# Patient Record
Sex: Male | Born: 1985 | Race: White | Hispanic: No | Marital: Married | Smoking: Never smoker
Health system: Southern US, Community
[De-identification: ages and names within clinical notes are randomized; demographics above are authoritative.]

## PROBLEM LIST (undated history)

## (undated) DIAGNOSIS — Z86018 Personal history of other benign neoplasm: Secondary | ICD-10-CM

## (undated) HISTORY — PX: NO PAST SURGERIES: SHX2092

## (undated) HISTORY — DX: Personal history of other benign neoplasm: Z86.018

---

## 2013-02-19 ENCOUNTER — Emergency Department (HOSPITAL_COMMUNITY)
Admission: EM | Admit: 2013-02-19 | Discharge: 2013-02-19 | Disposition: A | Discharge status: Home / Self Care | Payer: 59 | Source: Home / Self Care

## 2013-02-19 ENCOUNTER — Encounter (HOSPITAL_COMMUNITY): Payer: Self-pay | Admitting: Emergency Medicine

## 2013-02-19 DIAGNOSIS — L719 Rosacea, unspecified: Secondary | ICD-10-CM

## 2013-02-19 DIAGNOSIS — S80869A Insect bite (nonvenomous), unspecified lower leg, initial encounter: Secondary | ICD-10-CM

## 2013-02-19 DIAGNOSIS — S90569A Insect bite (nonvenomous), unspecified ankle, initial encounter: Secondary | ICD-10-CM

## 2013-02-19 DIAGNOSIS — W57XXXA Bitten or stung by nonvenomous insect and other nonvenomous arthropods, initial encounter: Secondary | ICD-10-CM

## 2013-02-19 DIAGNOSIS — T7840XA Allergy, unspecified, initial encounter: Secondary | ICD-10-CM

## 2013-02-19 MED ORDER — CLINDAMYCIN PHOSPHATE 1 % EX GEL: Freq: Two times a day (BID) | CUTANEOUS | Status: DC

## 2013-02-19 MED ORDER — TRIAMCINOLONE ACETONIDE 0.1 % EX CREA: TOPICAL_CREAM | Freq: Two times a day (BID) | CUTANEOUS | Status: DC

## 2013-02-19 MED ORDER — PERMETHRIN 5 % EX CREA: TOPICAL_CREAM | CUTANEOUS | Status: DC

## 2013-02-19 NOTE — ED Notes (Signed)
Pt c/o of insect bites all over body. Noticed first bite two days ago on back of right leg. Bumps have spread from legs to feet and also a couple on arms. Not painful just very itchy. Has tried Hydrocortisone and Benadryl with relief. No fever, n/v, diarrhea. Also pt would like Rx for his rosacea.

## 2013-02-19 NOTE — ED Provider Notes (Signed)
History     CSN: [id number]  Arrival date & time 02/19/13  1114   First MD Initiated Contact with Patient 02/19/13 1152      Chief Complaint  Patient presents with  . Insect Bite    (Consider location/radiation/quality/duration/timing/severity/associated sxs/prior treatment) HPI Comments: 27 year old man has been experiencing itchy red bumps starting on his feet and lower legs for approximately 3 weeks. The small red bumps have spread to his upper extremities and lesser to his torso. All of these areas are primarily keeping is erythema with some areas performing a small papule. He has applied various OTC creams but they persist. They will disappear from one area in the informant another. He does not describe them as wheals or urticarial light. He denies constitutional symptoms. There is a cat in the house but he states he rarely goes into his room.  The second request is that of a cream to apply to his rosacea these had since childhood.   History reviewed. No pertinent past medical history.  History reviewed. No pertinent past surgical history.  History reviewed. No pertinent family history.  History  Substance Use Topics  . Smoking status: Never Smoker   . Smokeless tobacco: Not on file  . Alcohol Use: 1.2 oz/week    2 Cans of beer per week     Comment: on weekends      Review of Systems  Constitutional: Negative.   HENT: Negative.   Respiratory: Negative.   Gastrointestinal: Negative.   Skin:       Erythematous acneform lesions across the nose and upper face he is in a masklike fashion. Small red macules of different sizes some  as small papules. No lymphangitis, drainage or signs of infection.   Neurological: Negative.   Psychiatric/Behavioral: Negative.     Allergies  Review of patient's allergies indicates no known allergies.  Home Medications   Current Outpatient Rx  Name  Route  Sig  Dispense  Refill  . clindamycin (CLINDAGEL) 1 % gel   Topical   Apply  topically 2 (two) times daily.   30 g   0   . permethrin (ELIMITE) 5 % cream      Apply from head to toe then rinse 8 hours   60 g   0   . triamcinolone cream (KENALOG) 0.1 %   Topical   Apply topically 2 (two) times daily. Apply to bug bites bid for 2 weeks. May use on face   30 g   0     BP 126/59  Pulse 87  Temp(Src) 98.4 F (36.9 C) (Oral)  SpO2 100%  Physical Exam  ED Course  Procedures (including critical care time)  Labs Reviewed - No data to display No results found.   1. Allergic to bugs, initial encounter   2. Insect bite of lower limb, unspecified laterality, initial encounter   3. Rosacea       MDM  To light cream, apply from head to toe and wrist in 8 hours Triamcinolone cream apply to  Bug bites 3 times a day as needed.  Sherren Kerns, applied to rosacea BID. Obtain a primary care doctor as soon as possible for followup.        Hayden Rasmussen, NP 02/19/13 1225

## 2013-02-22 NOTE — ED Provider Notes (Signed)
Medical screening examination/treatment/procedure(s) were performed by resident physician or non-physician practitioner and as supervising physician I was immediately available for consultation/collaboration.   KINDL,JAMES DOUGLAS MD.   James D Kindl, MD 02/22/13 1025 

## 2017-10-30 ENCOUNTER — Encounter: Payer: Self-pay | Admitting: Physician Assistant

## 2017-10-30 ENCOUNTER — Ambulatory Visit (INDEPENDENT_AMBULATORY_CARE_PROVIDER_SITE_OTHER): Payer: 59 | Admitting: Physician Assistant

## 2017-10-30 VITALS — BP 118/76 | HR 80 | Temp 98.3°F | Resp 16 | Ht 70.0 in | Wt 251.0 lb

## 2017-10-30 DIAGNOSIS — E669 Obesity, unspecified: Secondary | ICD-10-CM

## 2017-10-30 DIAGNOSIS — E785 Hyperlipidemia, unspecified: Secondary | ICD-10-CM

## 2017-10-30 DIAGNOSIS — G8929 Other chronic pain: Secondary | ICD-10-CM | POA: Diagnosis not present

## 2017-10-30 DIAGNOSIS — M79671 Pain in right foot: Secondary | ICD-10-CM

## 2017-10-30 DIAGNOSIS — M546 Pain in thoracic spine: Secondary | ICD-10-CM

## 2017-10-30 DIAGNOSIS — Z0000 Encounter for general adult medical examination without abnormal findings: Secondary | ICD-10-CM | POA: Diagnosis not present

## 2017-10-30 DIAGNOSIS — Z6836 Body mass index (BMI) 36.0-36.9, adult: Secondary | ICD-10-CM
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
## 2017-10-30 NOTE — Patient Instructions (Addendum)

## 2017-11-03 DIAGNOSIS — E785 Hyperlipidemia, unspecified: Secondary | ICD-10-CM | POA: Insufficient documentation

## 2017-11-03 DIAGNOSIS — Z6836 Body mass index (BMI) 36.0-36.9, adult: Secondary | ICD-10-CM

## 2017-11-03 DIAGNOSIS — E669 Obesity, unspecified: Secondary | ICD-10-CM | POA: Insufficient documentation

## 2017-11-03 DIAGNOSIS — M79671 Pain in right foot: Secondary | ICD-10-CM | POA: Insufficient documentation

## 2017-11-03 DIAGNOSIS — E66812 Obesity, class 2: Secondary | ICD-10-CM | POA: Insufficient documentation

## 2018-02-08 ENCOUNTER — Other Ambulatory Visit: Payer: Self-pay | Admitting: Physician Assistant

## 2018-02-08 ENCOUNTER — Telehealth: Payer: Self-pay | Admitting: Physician Assistant

## 2018-02-08 DIAGNOSIS — M79671 Pain in right foot: Secondary | ICD-10-CM

## 2018-02-08 DIAGNOSIS — M79672 Pain in left foot: Secondary | ICD-10-CM

## 2018-02-08 NOTE — Telephone Encounter (Signed)
Referral placed for podiatry

## 2018-02-08 NOTE — Telephone Encounter (Signed)
Patient is still having problems with his right foot but  now it's both feet. Both feet hurting him in the arch.   He wants referral to Podiatrist.

## 2018-02-08 NOTE — Progress Notes (Signed)
Referral to podiatry placed for bilateral foot pain.

## 2018-02-11 ENCOUNTER — Ambulatory Visit (INDEPENDENT_AMBULATORY_CARE_PROVIDER_SITE_OTHER): Payer: 59 | Admitting: Physician Assistant

## 2018-02-11 ENCOUNTER — Encounter: Payer: Self-pay | Admitting: Physician Assistant

## 2018-02-11 VITALS — BP 132/84 | HR 88 | Temp 99.5°F | Resp 16 | Ht 70.0 in | Wt 250.0 lb

## 2018-02-11 DIAGNOSIS — E6609 Other obesity due to excess calories: Secondary | ICD-10-CM

## 2018-02-11 DIAGNOSIS — Z713 Dietary counseling and surveillance: Secondary | ICD-10-CM | POA: Diagnosis not present

## 2018-02-11 DIAGNOSIS — Z6835 Body mass index (BMI) 35.0-35.9, adult: Secondary | ICD-10-CM

## 2018-02-11 MED ORDER — PHENTERMINE HCL 37.5 MG PO TABS: 37.5000 mg | ORAL_TABLET | Freq: Every day | ORAL | 0 refills | Status: DC

## 2018-02-11 NOTE — Progress Notes (Signed)
Patient: Derek GheeGary W Kneisel Jr. Male    DOB: 08/03/1986   32 y.o.   MRN: [id number] Visit Date: 02/11/2018  Today's Provider: Margaretann LovelessJennifer M Cheryel Kyte, PA-C   Chief Complaint  Patient presents with  . Obesity  . Foot Pain   Subjective:    HPI Obesity: Patient complains of obesity. Patient cites health as reasons for wanting to lose weight. Current Exercise Habits Exercising during the week with some light cardio and weights. Was previously running until started having issues with right foot. Referral already placed to podiatry.  Diet habits: trying to eat healthier. Reports being hungry at nighttime. Eats protein shake for breakfast, lunch varies depending on what is brought to job, then suppers are cooked by his wife mostly with occasional dining out options.      No Known Allergies   Current Outpatient Medications:  .  niacin 250 MG tablet, Take 250 mg by mouth at bedtime., Disp: , Rfl:  .  Omega-3 Fatty Acids (FISH OIL) 1000 MG CAPS, Take by mouth., Disp: , Rfl:  .  clindamycin (CLINDAGEL) 1 % gel, Apply topically 2 (two) times daily. (Patient not taking: Reported on 02/11/2018), Disp: 30 g, Rfl: 0 .  permethrin (ELIMITE) 5 % cream, Apply from head to toe then rinse 8 hours (Patient not taking: Reported on 02/11/2018), Disp: 60 g, Rfl: 0 .  triamcinolone cream (KENALOG) 0.1 %, Apply topically 2 (two) times daily. Apply to bug bites bid for 2 weeks. May use on face (Patient not taking: Reported on 02/11/2018), Disp: 30 g, Rfl: 0  Review of Systems  Constitutional: Negative.   Respiratory: Negative.   Cardiovascular: Negative for chest pain and leg swelling.  Endocrine: Negative.   Musculoskeletal: Positive for arthralgias and gait problem.       Has occasional right foot pain  Skin: Negative.   Allergic/Immunologic: Negative.   Psychiatric/Behavioral: Negative.     Social History   Tobacco Use  . Smoking status: Never Smoker  . Smokeless tobacco: Never Used  Substance Use  Topics  . Alcohol use: Yes    Alcohol/week: 1.2 oz    Types: 2 Cans of beer per week    Comment: on weekends   Objective:   BP 132/84 (BP Location: Right Arm, Patient Position: Sitting, Cuff Size: Large)   Pulse 88   Temp 99.5 F (37.5 C)   Resp 16   Ht 5\' 10"  (1.778 m)   Wt 250 lb (113.4 kg)   BMI 35.87 kg/m     Physical Exam  Constitutional: He appears well-developed and well-nourished. No distress.  HENT:  Head: Normocephalic and atraumatic.  Neck: Normal range of motion. Neck supple.  Cardiovascular: Normal rate, regular rhythm and normal heart sounds. Exam reveals no gallop and no friction rub.  No murmur heard. Pulmonary/Chest: Effort normal and breath sounds normal. No respiratory distress. He has no wheezes. He has no rales.  Skin: He is not diaphoretic.  Psychiatric: He has a normal mood and affect. His behavior is normal. Judgment and thought content normal.  Vitals reviewed.       Assessment & Plan:     1. Class 2 obesity due to excess calories without serious comorbidity with body mass index (BMI) of 35.0 to 35.9 in adult Long discussion had over diet habits, exercise and calorie counting. Will add phentermine as below for appetite suppression. I will see him back in 4 weeks for recheck of weight.  - phentermine (ADIPEX-P) 37.5  MG tablet; Take 1 tablet (37.5 mg total) by mouth daily before breakfast.  Dispense: 30 tablet; Refill: 0  2. Weight loss counseling, encounter for See above medical treatment plan. - phentermine (ADIPEX-P) 37.5 MG tablet; Take 1 tablet (37.5 mg total) by mouth daily before breakfast.  Dispense: 30 tablet; Refill: 0  I spent approximately 45 minutes with the patient today. Over 50% of this time was spent with counseling and educating the patient.      Margaretann Loveless, PA-C  [facility]

## 2018-02-12 ENCOUNTER — Encounter: Payer: Self-pay | Admitting: Physician Assistant

## 2018-02-23 ENCOUNTER — Ambulatory Visit: Payer: Self-pay | Admitting: Podiatry

## 2018-03-08 DIAGNOSIS — Z7722 Contact with and (suspected) exposure to environmental tobacco smoke (acute) (chronic): Secondary | ICD-10-CM | POA: Insufficient documentation

## 2018-03-08 DIAGNOSIS — Z8619 Personal history of other infectious and parasitic diseases: Secondary | ICD-10-CM | POA: Insufficient documentation

## 2018-03-09 ENCOUNTER — Encounter: Payer: Self-pay | Admitting: Podiatry

## 2018-03-09 ENCOUNTER — Ambulatory Visit (INDEPENDENT_AMBULATORY_CARE_PROVIDER_SITE_OTHER): Payer: 59

## 2018-03-09 ENCOUNTER — Other Ambulatory Visit: Payer: Self-pay | Admitting: Podiatry

## 2018-03-09 ENCOUNTER — Ambulatory Visit (INDEPENDENT_AMBULATORY_CARE_PROVIDER_SITE_OTHER): Payer: 59 | Admitting: Podiatry

## 2018-03-09 DIAGNOSIS — M722 Plantar fascial fibromatosis: Secondary | ICD-10-CM

## 2018-03-09 DIAGNOSIS — M779 Enthesopathy, unspecified: Secondary | ICD-10-CM

## 2018-03-09 MED ORDER — MELOXICAM 15 MG PO TABS: 15.0000 mg | ORAL_TABLET | Freq: Every day | ORAL | 1 refills | Status: AC

## 2018-03-11 NOTE — Progress Notes (Signed)
   Subjective: 32 year old male presenting today as a new patient with a chief complaint of pain to the plantar aspect of the right heel that began 4-5 months ago. He states the pain is the worse with his first few steps out of bed. Standing and walking for long periods of time makes the pain worse. He has not done anything to treat the symptoms. Patient is here for further evaluation and treatment.   No past medical history on file.   Objective: Physical Exam General: The patient is alert and oriented x3 in no acute distress.  Dermatology: Skin is warm, dry and supple bilateral lower extremities. Negative for open lesions or macerations bilateral.   Vascular: Dorsalis Pedis and Posterior Tibial pulses palpable bilateral.  Capillary fill time is immediate to all digits.  Neurological: Epicritic and protective threshold intact bilateral.   Musculoskeletal: Tenderness to palpation to the plantar aspect of the right heel along the plantar fascia. All other joints range of motion within normal limits bilateral. Strength 5/5 in all groups bilateral.   Radiographic exam: Normal osseous mineralization. Joint spaces preserved. No fracture/dislocation/boney destruction. No other soft tissue abnormalities or radiopaque foreign bodies.   Assessment: 1. Plantar fasciitis right - improving  2. Pain in right foot  Plan of Care:  1. Patient evaluated. Xrays reviewed.   2. Rx for Meloxicam provided to patient.  3. Plantar fascial band(s) dispensed 4. Recommended good shoe gear.  5. Instructed patient regarding therapies and modalities at home to alleviate symptoms.  6. Stressed the importance of stretching at home.  7. Return to clinic as needed.      Felecia ShellingBrent M. Vittoria Noreen, DPM [facility]  Dr. Felecia ShellingBrent M. Caitlin Ainley, DPM    [address]                                        [place], [place] [address]                Office [phone]  Fax [phone]

## 2018-03-12 ENCOUNTER — Ambulatory Visit (INDEPENDENT_AMBULATORY_CARE_PROVIDER_SITE_OTHER): Payer: 59 | Admitting: Physician Assistant

## 2018-03-12 ENCOUNTER — Encounter: Payer: Self-pay | Admitting: Physician Assistant

## 2018-03-12 VITALS — BP 120/80 | HR 115 | Temp 98.5°F | Resp 16 | Wt 247.2 lb

## 2018-03-12 DIAGNOSIS — Z6835 Body mass index (BMI) 35.0-35.9, adult: Secondary | ICD-10-CM

## 2018-03-12 DIAGNOSIS — E6609 Other obesity due to excess calories: Secondary | ICD-10-CM

## 2018-03-12 DIAGNOSIS — Z713 Dietary counseling and surveillance: Secondary | ICD-10-CM

## 2018-03-12 MED ORDER — PHENTERMINE HCL 37.5 MG PO TABS: 37.5000 mg | ORAL_TABLET | Freq: Every day | ORAL | 2 refills | Status: DC

## 2018-03-12 NOTE — Progress Notes (Signed)
Patient: Derek Wheeler. Male    DOB: [birth year]   32 y.o.   MRN: [id number] Visit Date: 03/12/2018  Today's Provider: Margaretann Loveless, PA-C   Chief Complaint  Patient presents with  . Follow-up    Obesity   Subjective:    HPI  Obesity, Follow up:  The patient was last seen for Obesity 1 months ago. Changes made since that visit include Long discussion had over diet habits, exercise and calorie counting.Phentermine added for appetite suppression.  He reports excellent compliance with treatment. He is not having side effects. .  Wt Readings from Last 3 Encounters:  03/12/18 247 lb 3.2 oz (112.1 kg)  02/11/18 250 lb (113.4 kg)  10/30/17 251 lb (113.9 kg)    Current Exercise Habits: Structured exercise class, Type of exercise: strength training/weights, Time (Minutes): 45, Frequency (Times/Week): 4, Weekly Exercise (Minutes/Week): 180, Intensity: Moderate Exercise limited by: orthopedic condition(s) Punching bags and moving a little.     No Known Allergies   Current Outpatient Medications:  .  meloxicam (MOBIC) 15 MG tablet, Take 1 tablet (15 mg total) by mouth daily., Disp: 60 tablet, Rfl: 1 .  Omega-3 Fatty Acids (FISH OIL) 1000 MG CAPS, Take by mouth., Disp: , Rfl:  .  phentermine (ADIPEX-P) 37.5 MG tablet, Take 1 tablet (37.5 mg total) by mouth daily before breakfast., Disp: 30 tablet, Rfl: 0 .  triamcinolone cream (KENALOG) 0.1 %, Apply topically 2 (two) times daily. Apply to bug bites bid for 2 weeks. May use on face (Patient not taking: Reported on 03/12/2018), Disp: 30 g, Rfl: 0  Review of Systems  Constitutional: Negative for fatigue.  Respiratory: Negative for cough, chest tightness and shortness of breath.   Cardiovascular: Negative for chest pain, palpitations and leg swelling.  Neurological: Negative for dizziness.    Social History   Tobacco Use  . Smoking status: Never Smoker  . Smokeless tobacco: Never Used  Substance Use Topics  .  Alcohol use: Yes    Alcohol/week: 1.2 oz    Types: 2 Cans of beer per week    Comment: on weekends   Objective:   BP 120/80 (BP Location: Left Arm, Patient Position: Sitting, Cuff Size: Normal)   Pulse (!) 115   Temp 98.5 F (36.9 C) (Oral)   Resp 16   Wt 247 lb 3.2 oz (112.1 kg)   SpO2 99%   BMI 35.47 kg/m    Physical Exam  Constitutional: He appears well-developed and well-nourished. No distress.  HENT:  Head: Normocephalic and atraumatic.  Neck: Normal range of motion. Neck supple. No JVD present. No tracheal deviation present. No thyromegaly present.  Cardiovascular: Normal rate, regular rhythm and normal heart sounds. Exam reveals no gallop and no friction rub.  No murmur heard. Pulmonary/Chest: Effort normal and breath sounds normal. No respiratory distress. He has no wheezes. He has no rales.  Lymphadenopathy:    He has no cervical adenopathy.  Skin: He is not diaphoretic.  Psychiatric: He has a normal mood and affect. His behavior is normal. Judgment and thought content normal.  Vitals reviewed.      Assessment & Plan:     1. Class 2 obesity due to excess calories without serious comorbidity with body mass index (BMI) of 35.0 to 35.9 in adult Doing well. Has lost 4-5 pounds since starting. Will continue phentermine as below. I will see him back in 3 months for a weight recheck.  - phentermine (ADIPEX-P) 37.5  MG tablet; Take 1 tablet (37.5 mg total) by mouth daily before breakfast.  Dispense: 30 tablet; Refill: 2  2. Weight loss counseling, encounter for See above medical treatment plan. - phentermine (ADIPEX-P) 37.5 MG tablet; Take 1 tablet (37.5 mg total) by mouth daily before breakfast.  Dispense: 30 tablet; Refill: 2       Margaretann LovelessJennifer M Burnette, PA-C  [facility]

## 2018-04-08 ENCOUNTER — Encounter: Payer: Self-pay | Admitting: Physician Assistant

## 2018-04-08 ENCOUNTER — Telehealth: Payer: Self-pay | Admitting: Physician Assistant

## 2018-04-08 ENCOUNTER — Encounter: Payer: Self-pay | Admitting: Podiatry

## 2018-04-08 DIAGNOSIS — L700 Acne vulgaris: Secondary | ICD-10-CM

## 2018-04-08 MED ORDER — TRETINOIN 0.025 % EX CREA: TOPICAL_CREAM | Freq: Every day | CUTANEOUS | 0 refills | Status: DC

## 2018-04-08 NOTE — Telephone Encounter (Signed)
Pt is going to the gym to try to lose weight but since is having a problem with breakouts of his face mainly forehead.  He has tried OTC stuff but it's not helping.  He wants to know if you can send something to the pharmacy  [facility] [address]  Pt's call back is [phone]  Thanks teri

## 2018-04-08 NOTE — Telephone Encounter (Signed)
See my chart encounter.

## 2018-04-13 ENCOUNTER — Telehealth: Payer: Self-pay

## 2018-04-13 NOTE — Telephone Encounter (Signed)
Prior authorization for tretinoin cream initiated.  Patient used good [facility] to get discounted price and has picked up the rx.  Prior Berkley Harvey will still be processed.

## 2018-08-04 ENCOUNTER — Encounter: Payer: Self-pay | Admitting: Physician Assistant

## 2018-08-25 ENCOUNTER — Ambulatory Visit (INDEPENDENT_AMBULATORY_CARE_PROVIDER_SITE_OTHER): Payer: 59 | Admitting: Physician Assistant

## 2018-08-25 ENCOUNTER — Encounter: Payer: Self-pay | Admitting: Physician Assistant

## 2018-08-25 VITALS — BP 120/80 | HR 107 | Temp 98.3°F | Resp 16 | Wt 245.8 lb

## 2018-08-25 DIAGNOSIS — L700 Acne vulgaris: Secondary | ICD-10-CM

## 2018-08-25 DIAGNOSIS — L719 Rosacea, unspecified: Secondary | ICD-10-CM | POA: Diagnosis not present

## 2018-08-25 DIAGNOSIS — L219 Seborrheic dermatitis, unspecified: Secondary | ICD-10-CM | POA: Diagnosis not present

## 2018-08-25 MED ORDER — CLINDAMYCIN PHOS-BENZOYL PEROX 1-5 % EX GEL: Freq: Two times a day (BID) | CUTANEOUS | 1 refills | Status: DC

## 2018-08-25 MED ORDER — CLOBETASOL PROPIONATE 0.05 % EX SOLN: 1.0000 "application " | Freq: Two times a day (BID) | CUTANEOUS | 0 refills | Status: DC
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
## 2018-08-25 NOTE — Patient Instructions (Signed)
Seborrheic Dermatitis, Adult Seborrheic dermatitis is a skin disease that causes red, scaly patches. It usually occurs on the scalp, and it is often called dandruff. The patches may appear on other parts of the body. Skin patches tend to appear where there are many oil glands in the skin. Areas of the body that are commonly affected include:  Scalp.  Skin folds of the body.  Ears.  Eyebrows.  Neck.  Face.  Armpits.  The bearded area of men's faces.  The condition may come and go for no known reason, and it is often long-lasting (chronic). What are the causes? The cause of this condition is not known. What increases the risk? This condition is more likely to develop in people who:  Have certain conditions, such as: ? HIV (human immunodeficiency virus). ? AIDS (acquired immunodeficiency syndrome). ? Parkinson disease. ? Mood disorders, such as depression.  Are 40-60 years old.  What are the signs or symptoms? Symptoms of this condition include:  Thick scales on the scalp.  Redness on the face or in the armpits.  Skin that is flaky. The flakes may be white or yellow.  Skin that seems oily or dry but is not helped with moisturizers.  Itching or burning in the affected areas.  How is this diagnosed? This condition is diagnosed with a medical history and physical exam. A sample of your skin may be tested (skin biopsy). You may need to see a skin specialist (dermatologist). How is this treated? There is no cure for this condition, but treatment can help to manage the symptoms. You may get treatment to remove scales, lower the risk of skin infection, and reduce swelling or itching. Treatment may include:  Creams that reduce swelling and irritation (steroids).  Creams that reduce skin yeast.  Medicated shampoo, soaps, moisturizing creams, or ointments.  Medicated moisturizing creams or ointments.  Follow these instructions at home:  Apply over-the-counter and  prescription medicines only as told by your health care provider.  Use any medicated shampoo, soaps, skin creams, or ointments only as told by your health care provider.  Keep all follow-up visits as told by your health care provider. This is important. Contact a health care provider if:  Your symptoms do not improve with treatment.  Your symptoms get worse.  You have new symptoms. This information is not intended to replace advice given to you by your health care provider. Make sure you discuss any questions you have with your health care provider. Document Released: 11/17/2005 Document Revised: 06/06/2016 Document Reviewed: 03/06/2016 Elsevier Interactive Patient Education  2018 Elsevier Inc.  

## 2018-09-10 DIAGNOSIS — Z86018 Personal history of other benign neoplasm: Secondary | ICD-10-CM

## 2018-09-10 HISTORY — DX: Personal history of other benign neoplasm: Z86.018

## 2018-09-22 ENCOUNTER — Emergency Department: Payer: 59

## 2018-09-22 ENCOUNTER — Other Ambulatory Visit: Payer: Self-pay

## 2018-09-22 ENCOUNTER — Encounter: Payer: Self-pay | Admitting: Emergency Medicine

## 2018-09-22 ENCOUNTER — Observation Stay
Admission: EM | Admit: 2018-09-22 | Discharge: 2018-09-23 | Disposition: A | Discharge status: Home / Self Care | Payer: 59 | Attending: Surgery | Admitting: Surgery

## 2018-09-22 DIAGNOSIS — K3580 Unspecified acute appendicitis: Principal | ICD-10-CM | POA: Diagnosis present

## 2018-09-22 DIAGNOSIS — R1031 Right lower quadrant pain: Secondary | ICD-10-CM | POA: Diagnosis present

## 2018-09-22 LAB — BASIC METABOLIC PANEL
Anion gap: 8 (ref 5–15)
BUN: 16 mg/dL (ref 6–20)
CHLORIDE: 106 mmol/L (ref 98–111)
CO2: 27 mmol/L (ref 22–32)
Calcium: 9.7 mg/dL (ref 8.9–10.3)
Creatinine, Ser: 1.09 mg/dL (ref 0.61–1.24)
GFR calc Af Amer: >60 mL/min (ref >60)
GFR calc non Af Amer: >60 mL/min (ref >60)
GLUCOSE: 99 mg/dL (ref 70–99)
POTASSIUM: 3.9 mmol/L (ref 3.5–5.1)
Sodium: 141 mmol/L (ref 135–145)

## 2018-09-22 LAB — CBC WITH DIFFERENTIAL/PLATELET
ABS IMMATURE GRANULOCYTES: 0.04 10*3/uL (ref 0.00–0.07)
Basophils Absolute: 0.1 10*3/uL (ref 0.0–0.1)
Basophils Relative: 1 %
Eosinophils Absolute: 0 10*3/uL (ref 0.0–0.5)
Eosinophils Relative: 0 %
HCT: 45.3 % (ref 39.0–52.0)
HEMOGLOBIN: 15.4 g/dL (ref 13.0–17.0)
Immature Granulocytes: 0 %
LYMPHS PCT: 27 %
Lymphs Abs: 2.9 10*3/uL (ref 0.7–4.0)
MCH: 28.9 pg (ref 26.0–34.0)
MCHC: 34 g/dL (ref 30.0–36.0)
MCV: 85.2 fL (ref 80.0–100.0)
MONO ABS: 0.9 10*3/uL (ref 0.1–1.0)
MONOS PCT: 8 %
Neutro Abs: 6.7 10*3/uL (ref 1.7–7.7)
Neutrophils Relative %: 64 %
Platelets: 365 10*3/uL (ref 150–400)
RBC: 5.32 MIL/uL (ref 4.22–5.81)
RDW: 12.9 % (ref 11.5–15.5)
WBC: 10.6 10*3/uL — ABNORMAL HIGH (ref 4.0–10.5)
nRBC: 0 % (ref 0.0–0.2)

## 2018-09-22 LAB — SURGICAL PCR SCREEN
MRSA, PCR: NEGATIVE
STAPHYLOCOCCUS AUREUS: NEGATIVE

## 2018-09-22 MED ORDER — MORPHINE SULFATE (PF) 2 MG/ML IV SOLN: 2.0000 mg | INTRAVENOUS | Status: DC | PRN

## 2018-09-22 MED ORDER — KETOROLAC TROMETHAMINE 30 MG/ML IJ SOLN
30.0000 mg | Freq: Four times a day (QID) | INTRAMUSCULAR | Status: DC
  Administered 2018-09-22 – 2018-09-23 (×4): 30 mg via INTRAVENOUS
  Filled 2018-09-22 (×4): qty 1

## 2018-09-22 MED ORDER — KETOROLAC TROMETHAMINE 30 MG/ML IJ SOLN
15.0000 mg | INTRAMUSCULAR | Status: AC
  Administered 2018-09-22: 15 mg via INTRAVENOUS
  Filled 2018-09-22: qty 1

## 2018-09-22 MED ORDER — KETOROLAC TROMETHAMINE 30 MG/ML IJ SOLN: 30.0000 mg | Freq: Four times a day (QID) | INTRAMUSCULAR | Status: DC | PRN

## 2018-09-22 MED ORDER — IOPAMIDOL (ISOVUE-300) INJECTION 61%
100.0000 mL | Freq: Once | INTRAVENOUS | Status: AC | PRN
  Administered 2018-09-22: 100 mL via INTRAVENOUS

## 2018-09-22 MED ORDER — SODIUM CHLORIDE 0.9 % IV SOLN
INTRAVENOUS | Status: DC
  Administered 2018-09-22 – 2018-09-23 (×2): via INTRAVENOUS

## 2018-09-22 MED ORDER — ONDANSETRON HCL 4 MG/2ML IJ SOLN
4.0000 mg | Freq: Four times a day (QID) | INTRAMUSCULAR | Status: DC | PRN
  Administered 2018-09-23: 4 mg via INTRAVENOUS

## 2018-09-22 MED ORDER — METRONIDAZOLE IN NACL 5-0.79 MG/ML-% IV SOLN
500.0000 mg | Freq: Three times a day (TID) | INTRAVENOUS | Status: DC
  Administered 2018-09-22 – 2018-09-23 (×2): 500 mg via INTRAVENOUS
  Filled 2018-09-22 (×6): qty 100

## 2018-09-22 MED ORDER — SODIUM CHLORIDE 0.9 % IV SOLN
2.0000 g | INTRAVENOUS | Status: DC
  Filled 2018-09-22 (×2): qty 20

## 2018-09-22 MED ORDER — ONDANSETRON 4 MG PO TBDP: 4.0000 mg | ORAL_TABLET | Freq: Four times a day (QID) | ORAL | Status: DC | PRN

## 2018-09-22 MED ORDER — LACTATED RINGERS IV SOLN: INTRAVENOUS | Status: DC

## 2018-09-22 MED ORDER — ONDANSETRON HCL 4 MG/2ML IJ SOLN
4.0000 mg | Freq: Once | INTRAMUSCULAR | Status: AC
  Administered 2018-09-22: 4 mg via INTRAVENOUS
  Filled 2018-09-22: qty 2

## 2018-09-22 MED ORDER — ENOXAPARIN SODIUM 40 MG/0.4ML ~~LOC~~ SOLN
40.0000 mg | SUBCUTANEOUS | Status: DC
  Administered 2018-09-22: 40 mg via SUBCUTANEOUS
  Filled 2018-09-22: qty 0.4

## 2018-09-22 MED ORDER — SODIUM CHLORIDE 0.9 % IV BOLUS
1000.0000 mL | Freq: Once | INTRAVENOUS | Status: AC
  Administered 2018-09-22: 1000 mL via INTRAVENOUS

## 2018-09-22 MED ORDER — ACETAMINOPHEN 500 MG PO TABS
1000.0000 mg | ORAL_TABLET | Freq: Four times a day (QID) | ORAL | Status: DC
  Administered 2018-09-22 – 2018-09-23 (×4): 1000 mg via ORAL
  Filled 2018-09-22 (×4): qty 2

## 2018-09-22 NOTE — ED Notes (Signed)
Patient transported to room 221 

## 2018-09-22 NOTE — ED Notes (Signed)
Patient ambulatory to Rm 18, Pattricia Boss RN aware of room placement.

## 2018-09-22 NOTE — ED Notes (Signed)
First Nurse Note: Patient states he was seen at Urgent Care and advised to come here for right sided abdominal pain "to see if it's appendicitis".  Patient advised to not eat or drink anything until after he is evaluated.  Alert and oriented.  NAD.

## 2018-09-22 NOTE — H&P (Addendum)
[name]  Derek Wheeler. 1986-11-14  [id number].    Requesting MD: Dr. Carrie Mew, MD Chief Complaint/Reason for Consult: Abdominal Pain  HPI:  Derek Wheeler. is a 32 y.o. male who presents to the ED this afternoon with abdominal pain. Patient notes that last night after dinner he developed the acute onset of achy RLQ pain which he attributed to eating bad food. The pain persisted into this morning without radiating anywhere prompted him to go to UC. At [facility], there was concern for appendicitis and he was sent to the ED for evaluation. He notes that the pain has note subsided and he has associated nausea and decreased appetite. He denied any fevers, chills, CP, SOB, emesis, diarrhea, urinary changes, or peripheral edema. No history of similar pain. Denied any history of previous abdominal surgeries. Work up in the ED revealed a leukocytosis and enlarged proximal appendix.  General surgery was consulted by emergency medicine physician Dr. Carrie Mew, MD for evaluation and management of possible acute appendicitis.   ROS: Review of Systems  Constitutional: Negative for chills and fever.  HENT: Negative for congestion.   Respiratory: Negative for cough and shortness of breath.   Cardiovascular: Negative for chest pain and leg swelling.  Gastrointestinal: Positive for abdominal pain and nausea. Negative for blood in stool, constipation, diarrhea and vomiting.  Genitourinary: Negative for dysuria and hematuria.  Musculoskeletal: Negative for myalgias.  All other systems reviewed and are negative.   Family History  Problem Relation Age of Onset  . Hypertension Mother   . Hyperlipidemia Mother   . Arthritis Father   . Hyperlipidemia Father   . Hypertension Maternal Grandfather   . Skin cancer Maternal Grandfather     History reviewed. No pertinent past medical history.  Past Surgical History:  Procedure Laterality Date  . NO PAST  SURGERIES      Social History:  reports that he has never smoked. He has never used smokeless tobacco. He reports that he drinks about 2.0 standard drinks of alcohol per week. He reports that he does not use drugs.  Allergies: No Known Allergies   (Not in a hospital admission)  Blood pressure 139/75, pulse 91, temperature 98.5 F (36.9 C), temperature source Oral, resp. rate 18, height _0  (1.778 m), weight 113.4 kg, SpO2 96 %.   Physical Exam: Physical Exam  Constitutional: He is oriented to person, place, and time. He appears well-developed and well-nourished.  Non-toxic appearance. No distress.  HENT:  Head: Normocephalic and atraumatic.  Eyes: Pupils are equal, round, and reactive to light. No scleral icterus.  Cardiovascular: Normal rate, regular rhythm and normal heart sounds. Exam reveals no gallop and no friction rub.  No murmur heard. Pulmonary/Chest: Effort normal and breath sounds normal. He has no wheezes. He has no rhonchi. He has no rales.  Abdominal: Soft. Normal appearance and bowel sounds are normal. He exhibits no distension. There is tenderness in the right lower quadrant. There is tenderness at McBurney's point. There is no rigidity, no rebound, no guarding and negative Murphy's sign.  + Rovsing's No peritoneal signs  Genitourinary:  Genitourinary Comments: Deferred  Neurological: He is alert and oriented to person, place, and time.  Skin: Skin is warm and dry.  Psychiatric: He has a normal mood and affect. His behavior is normal.    Results for orders placed or performed during the hospital encounter of 09/22/18 (from the past 48 hour(s))  Basic metabolic panel  Status: None   Collection Time: 09/22/18  9:55 AM  Result Value Ref Range   Sodium 141 135 - 145 mmol/L   Potassium 3.9 3.5 - 5.1 mmol/L   Chloride 106 98 - 111 mmol/L   CO2 27 22 - 32 mmol/L   Glucose, Bld 99 70 - 99 mg/dL   BUN 16 6 - 20 mg/dL   Creatinine, Ser 1.09 0.61 - 1.24 mg/dL    Calcium 9.7 8.9 - 10.3 mg/dL   GFR calc non Af Amer >60 >60 mL/min   GFR calc Af Amer >60 >60 mL/min    Comment: (NOTE) The eGFR has been calculated using the CKD EPI equation. This calculation has not been validated in all clinical situations. eGFR's persistently <60 mL/min signify possible Chronic Kidney Disease.    Anion gap 8 5 - 15    Comment: Performed at [facility], [address] [place], [place] [address]  CBC with Differential     Status: Abnormal   Collection Time: 09/22/18  9:55 AM  Result Value Ref Range   WBC 10.6 (H) 4.0 - 10.5 K/uL   RBC 5.32 4.22 - 5.81 MIL/uL   Hemoglobin 15.4 13.0 - 17.0 g/dL   HCT 45.3 39.0 - 52.0 %   MCV 85.2 80.0 - 100.0 fL   MCH 28.9 26.0 - 34.0 pg   MCHC 34.0 30.0 - 36.0 g/dL   RDW 12.9 11.5 - 15.5 %   Platelets 365 150 - 400 K/uL   nRBC 0.0 0.0 - 0.2 %   Neutrophils Relative % 64 %   Neutro Abs 6.7 1.7 - 7.7 K/uL   Lymphocytes Relative 27 %   Lymphs Abs 2.9 0.7 - 4.0 K/uL   Monocytes Relative 8 %   Monocytes Absolute 0.9 0.1 - 1.0 K/uL   Eosinophils Relative 0 %   Eosinophils Absolute 0.0 0.0 - 0.5 K/uL   Basophils Relative 1 %   Basophils Absolute 0.1 0.0 - 0.1 K/uL   Immature Granulocytes 0 %   Abs Immature Granulocytes 0.04 0.00 - 0.07 K/uL    Comment: Performed at [facility], [address] [place], [place] [address]   Ct Abdomen Pelvis W Contrast  Result Date: 09/22/2018 CLINICAL DATA:  Acute right lower quadrant abdominal pain. EXAM: CT ABDOMEN AND PELVIS WITH CONTRAST TECHNIQUE: Multidetector CT imaging of the abdomen and pelvis was performed using the standard protocol following bolus administration of intravenous contrast. CONTRAST:  165m ISOVUE-300 IOPAMIDOL (ISOVUE-300) INJECTION 61% COMPARISON:  None. FINDINGS: Lower chest: Normal. Hepatobiliary: No focal liver abnormality is seen. No gallstones, gallbladder wall thickening, or biliary dilatation. Pancreas: Unremarkable. No pancreatic ductal  dilatation or surrounding inflammatory changes. Spleen: Normal in size without focal abnormality. Adrenals/Urinary Tract: Adrenal glands are unremarkable. Kidneys are normal, without renal calculi, focal lesion, or hydronephrosis. Bladder is unremarkable. Stomach/Bowel: There is slight prominence of the proximal appendix to a diameter of 8 mm. No periappendiceal soft tissue inflammation. The bowel otherwise appears normal. Vascular/Lymphatic: No significant vascular findings are present. No enlarged abdominal or pelvic lymph nodes. Reproductive: Prostate is unremarkable. Other: No abdominal wall hernia or abnormality. No abdominopelvic ascites. Musculoskeletal: No acute or significant osseous findings. IMPRESSION: 1. Minimal prominence of the proximal appendix without periappendiceal inflammation. No fecalith. The appearance is indeterminate. 2. Otherwise benign appearing abdomen and pelvis. Electronically Signed   By: JLorriane ShireM.D.   On: 09/22/2018 11:18      Assessment/Plan  [facility]  32 y.[facility] with early acute appendicitis and leukocytosis which is not complicated by any current pertinent medical comorbidities.   - Admit to general surgery with plan for diagnostic laparoscopy and laparoscopic appendectomy tomorrow 10/24 with Dr. Dahlia Byes - Clear liquids for now, NPO after midnight - IV ABx (Ceftrizaxone + Flagyl), IVF - AM pre-operative labs - Pain control, anti-emetics - Mobilize - DVT Prophylaxis  All risks, benefits, and alternatives to above procedure(s) were discussed with the patient and all of his questions were answered to his expressed satisfaction, patient expresses he wishes to proceed, and informed consent was obtained.  -- Edison Simon, PA-C [facility] 09/22/2018, 1:46 PM [phone] M-F: 7am - 4pm

## 2018-09-22 NOTE — ED Provider Notes (Signed)
[facility] Emergency Department Provider Note  ____________________________________________  Time seen: Approximately 2:11 PM  I have reviewed the triage vital signs and the nursing notes.   HISTORY  Chief Complaint Abdominal Pain    HPI Derek Wheeler. is a 32 y.o. male with a history of obesity and hyperlipidemia who comes the ED complaining of right lower quadrant abdominal pain that started last night around midnight, constant, worsening, worse with movement, no alleviating factors.  No nausea vomiting or diarrhea.  No fevers chills or sweats.  Moderate intensity, aching.  Nonradiating.      History reviewed. No pertinent past medical history.   Patient Active Problem List   Diagnosis Date Noted  . History of Salmonella infection 03/08/2018  . Second hand smoke exposure 03/08/2018  . Class 2 obesity with body mass index (BMI) of 36.0 to 36.9 in adult 11/03/2017  . Hyperlipidemia 11/03/2017  . Right foot pain 11/03/2017     Past Surgical History:  Procedure Laterality Date  . NO PAST SURGERIES       Prior to Admission medications   Medication Sig Start Date End Date Taking? Authorizing Provider  clindamycin-benzoyl peroxide (BENZACLIN) gel Apply topically 2 (two) times daily. 08/25/18   Margaretann Loveless, PA-C  clobetasol (TEMOVATE) 0.05 % external solution Apply 1 application topically 2 (two) times daily. 08/25/18   Margaretann Loveless, PA-C  Omega-3 Fatty Acids (FISH OIL) 1000 MG CAPS Take by mouth.    [provider]  phentermine (ADIPEX-P) 37.5 MG tablet Take 1 tablet (37.5 mg total) by mouth daily before breakfast. 03/12/18   Burnette, Alessandra Bevels, PA-C  tretinoin (RETIN-A) 0.025 % cream Apply topically at bedtime. 04/08/18   Margaretann Loveless, PA-C  triamcinolone cream (KENALOG) 0.1 % Apply topically 2 (two) times daily. Apply to bug bites bid for 2 weeks. May use on face Patient not taking: Reported on 03/12/2018 02/19/13    Hayden Rasmussen, NP     Allergies Patient has no known allergies.   Family History  Problem Relation Age of Onset  . Hypertension Mother   . Hyperlipidemia Mother   . Arthritis Father   . Hyperlipidemia Father   . Hypertension Maternal Grandfather   . Skin cancer Maternal Grandfather     Social History Social History   Tobacco Use  . Smoking status: Never Smoker  . Smokeless tobacco: Never Used  Substance Use Topics  . Alcohol use: Yes    Alcohol/week: 2.0 standard drinks    Types: 2 Cans of beer per week    Comment: on weekends  . Drug use: No    Review of Systems  Constitutional:   No fever or chills.  ENT:   No sore throat. No rhinorrhea. Cardiovascular:   No chest pain or syncope. Respiratory:   No dyspnea or cough. Gastrointestinal: Positive as above for abdominal pain without vomiting and diarrhea.  Musculoskeletal:   Negative for focal pain or swelling All other systems reviewed and are negative except as documented above in ROS and HPI.  ____________________________________________   PHYSICAL EXAM:  VITAL SIGNS: ED Triage Vitals [09/22/18 0907]  Enc Vitals Group     BP 140/88     Pulse Rate 95     Resp 16     Temp 98.5 F (36.9 C)     Temp Source Oral     SpO2 98 %     Weight 250 lb (113.4 kg)     Height 5\' 10"  (1.778  m)     Head Circumference      Peak Flow      Pain Score 0     Pain Loc      Pain Edu?      Excl. in GC?     Vital signs reviewed, nursing assessments reviewed.   Constitutional:   Alert and oriented. Non-toxic appearance. Eyes:   Conjunctivae are normal. EOMI. PERRL. ENT      Head:   Normocephalic and atraumatic.      Nose:   No congestion/rhinnorhea.       Mouth/Throat:   MMM, no pharyngeal erythema. No peritonsillar mass.       Neck:   No meningismus. Full ROM. Hematological/Lymphatic/Immunilogical:   No cervical lymphadenopathy. Cardiovascular:   RRR. Symmetric bilateral radial and DP pulses.  No murmurs. Cap refill  less than 2 seconds. Respiratory:   Normal respiratory effort without tachypnea/retractions. Breath sounds are clear and equal bilaterally. No wheezes/rales/rhonchi. Gastrointestinal:   Soft with pronounced right lower quadrant tenderness.  Non distended. There is no CVA tenderness.  No rebound, rigidity, or guarding.  Musculoskeletal:   Normal range of motion in all extremities. No joint effusions.  No lower extremity tenderness.  No edema. Neurologic:   Normal speech and language.  Motor grossly intact. No acute focal neurologic deficits are appreciated.  Skin:    Skin is warm, dry and intact. No rash noted.  No petechiae, purpura, or bullae.  ____________________________________________    LABS (pertinent positives/negatives) (all labs ordered are listed, but only abnormal results are displayed) Labs Reviewed  CBC WITH DIFFERENTIAL/PLATELET - Abnormal; Notable for the following components:      Result Value   WBC 10.6 (*)    All other components within normal limits  BASIC METABOLIC PANEL   ____________________________________________   EKG    ____________________________________________    RADIOLOGY  Ct Abdomen Pelvis W Contrast  Result Date: 09/22/2018 CLINICAL DATA:  Acute right lower quadrant abdominal pain. EXAM: CT ABDOMEN AND PELVIS WITH CONTRAST TECHNIQUE: Multidetector CT imaging of the abdomen and pelvis was performed using the standard protocol following bolus administration of intravenous contrast. CONTRAST:  ISOVUE-300 IOPAMIDOL (ISOVUE-300) INJECTION 61% COMPARISON:  None. FINDINGS: Lower chest: Normal. Hepatobiliary: No focal liver abnormality is seen. No gallstones, gallbladder wall thickening, or biliary dilatation. Pancreas: Unremarkable. No pancreatic ductal dilatation or surrounding inflammatory changes. Spleen: Normal in size without focal abnormality. Adrenals/Urinary Tract: Adrenal glands are unremarkable. Kidneys are normal, without renal  calculi, focal lesion, or hydronephrosis. Bladder is unremarkable. Stomach/Bowel: There is slight prominence of the proximal appendix to a diameter of 8 mm. No periappendiceal soft tissue inflammation. The bowel otherwise appears normal. Vascular/Lymphatic: No significant vascular findings are present. No enlarged abdominal or pelvic lymph nodes. Reproductive: Prostate is unremarkable. Other: No abdominal wall hernia or abnormality. No abdominopelvic ascites. Musculoskeletal: No acute or significant osseous findings. IMPRESSION: 1. Minimal prominence of the proximal appendix without periappendiceal inflammation. No fecalith. The appearance is indeterminate. 2. Otherwise benign appearing abdomen and pelvis. Electronically Signed   By: Francene Boyers M.D.   On: 09/22/2018 11:18    ____________________________________________   PROCEDURES Procedures  ____________________________________________  DIFFERENTIAL DIAGNOSIS   Appendicitis, colitis, hernia  CLINICAL IMPRESSION / ASSESSMENT AND PLAN / ED COURSE  Pertinent labs & imaging results that were available during my care of the patient were reviewed by me and considered in my medical decision making (see chart for details).    Presents with severe right lower quadrant abdominal  pain.  Nontoxic and not septic.  CT scan shows enlarged appendix with proximal appendiceal swelling.  Given his symptoms going on less than 24 hours and focal concerning abdominal exam, I think this is consistent with early appendicitis.  Discussed with surgery who will admit for appendectomy likely to occur tomorrow.      ____________________________________________   FINAL CLINICAL IMPRESSION(S) / ED DIAGNOSES    Final diagnoses:  Acute appendicitis, unspecified acute appendicitis type  Right lower quadrant abdominal pain     ED Discharge Orders    None      Portions of this note were generated with dragon dictation software. Dictation errors may  occur despite best attempts at proofreading.    Sharman Cheek, MD 09/22/18 [phone]

## 2018-09-22 NOTE — ED Triage Notes (Addendum)
Pt to ED sent from UC with c/o sudden onset of RLQ abd pain last night. PT A&OX4, VSS, ambulatory

## 2018-09-22 NOTE — ED Notes (Signed)
Patient denies pain and is resting comfortably.  

## 2018-09-23 ENCOUNTER — Observation Stay: Payer: 59 | Admitting: Registered Nurse

## 2018-09-23 ENCOUNTER — Encounter
Admission: EM | Disposition: A | Discharge status: Home / Self Care | Payer: Self-pay | Source: Home / Self Care | Attending: Emergency Medicine

## 2018-09-23 ENCOUNTER — Encounter: Payer: Self-pay | Admitting: Surgery

## 2018-09-23 DIAGNOSIS — K3580 Unspecified acute appendicitis: Secondary | ICD-10-CM | POA: Diagnosis not present

## 2018-09-23 HISTORY — PX: LAPAROSCOPIC APPENDECTOMY: SHX408

## 2018-09-23 LAB — BASIC METABOLIC PANEL
Anion gap: 6 (ref 5–15)
BUN: 15 mg/dL (ref 6–20)
CO2: 26 mmol/L (ref 22–32)
Calcium: 8.6 mg/dL — ABNORMAL LOW (ref 8.9–10.3)
Chloride: 108 mmol/L (ref 98–111)
Creatinine, Ser: 1.2 mg/dL (ref 0.61–1.24)
GFR calc Af Amer: >60 mL/min (ref >60)
GLUCOSE: 120 mg/dL — AB (ref 70–99)
Potassium: 4.4 mmol/L (ref 3.5–5.1)
SODIUM: 140 mmol/L (ref 135–145)

## 2018-09-23 LAB — CBC
HCT: 39.5 % (ref 39.0–52.0)
HEMOGLOBIN: 13.1 g/dL (ref 13.0–17.0)
MCH: 28.5 pg (ref 26.0–34.0)
MCHC: 33.2 g/dL (ref 30.0–36.0)
MCV: 86.1 fL (ref 80.0–100.0)
Platelets: 315 10*3/uL (ref 150–400)
RBC: 4.59 MIL/uL (ref 4.22–5.81)
RDW: 12.7 % (ref 11.5–15.5)
WBC: 10.8 10*3/uL — AB (ref 4.0–10.5)
nRBC: 0 % (ref 0.0–0.2)

## 2018-09-23 SURGERY — APPENDECTOMY, LAPAROSCOPIC
Anesthesia: General

## 2018-09-23 MED ORDER — FENTANYL CITRATE (PF) 100 MCG/2ML IJ SOLN
INTRAMUSCULAR | Status: DC | PRN
  Administered 2018-09-23: 25 ug via INTRAVENOUS
  Administered 2018-09-23: 50 ug via INTRAVENOUS
  Administered 2018-09-23: 25 ug via INTRAVENOUS

## 2018-09-23 MED ORDER — MEPERIDINE HCL 50 MG/ML IJ SOLN: 6.2500 mg | INTRAMUSCULAR | Status: DC | PRN

## 2018-09-23 MED ORDER — DEXAMETHASONE SODIUM PHOSPHATE 10 MG/ML IJ SOLN
INTRAMUSCULAR | Status: AC
  Filled 2018-09-23: qty 1

## 2018-09-23 MED ORDER — HYDROCODONE-ACETAMINOPHEN 5-325 MG PO TABS: 1.0000 | ORAL_TABLET | ORAL | Status: DC | PRN

## 2018-09-23 MED ORDER — PROPOFOL 500 MG/50ML IV EMUL
INTRAVENOUS | Status: AC
  Filled 2018-09-23: qty 50

## 2018-09-23 MED ORDER — PHENYLEPHRINE HCL 10 MG/ML IJ SOLN
INTRAMUSCULAR | Status: DC | PRN
  Administered 2018-09-23: 100 ug via INTRAVENOUS

## 2018-09-23 MED ORDER — ROCURONIUM BROMIDE 100 MG/10ML IV SOLN
INTRAVENOUS | Status: DC | PRN
  Administered 2018-09-23: 10 mg via INTRAVENOUS
  Administered 2018-09-23: 35 mg via INTRAVENOUS
  Administered 2018-09-23: 5 mg via INTRAVENOUS

## 2018-09-23 MED ORDER — DEXAMETHASONE SODIUM PHOSPHATE 10 MG/ML IJ SOLN
INTRAMUSCULAR | Status: DC | PRN
  Administered 2018-09-23: 10 mg via INTRAVENOUS

## 2018-09-23 MED ORDER — LIDOCAINE HCL (CARDIAC) PF 100 MG/5ML IV SOSY
PREFILLED_SYRINGE | INTRAVENOUS | Status: DC | PRN
  Administered 2018-09-23: 100 mg via INTRAVENOUS

## 2018-09-23 MED ORDER — DEXMEDETOMIDINE HCL 200 MCG/2ML IV SOLN
INTRAVENOUS | Status: DC | PRN
  Administered 2018-09-23: 8 ug via INTRAVENOUS
  Administered 2018-09-23: 16 ug via INTRAVENOUS
  Administered 2018-09-23: 8 ug via INTRAVENOUS
  Administered 2018-09-23 (×2): 12 ug via INTRAVENOUS

## 2018-09-23 MED ORDER — ROCURONIUM BROMIDE 50 MG/5ML IV SOLN
INTRAVENOUS | Status: AC
  Filled 2018-09-23: qty 1

## 2018-09-23 MED ORDER — LACTATED RINGERS IV SOLN
INTRAVENOUS | Status: DC | PRN
  Administered 2018-09-23: 08:00:00 via INTRAVENOUS

## 2018-09-23 MED ORDER — BUPIVACAINE-EPINEPHRINE 0.25% -1:200000 IJ SOLN
INTRAMUSCULAR | Status: DC | PRN
  Administered 2018-09-23: 30 mL

## 2018-09-23 MED ORDER — SODIUM CHLORIDE 0.9 % IV BOLUS
1000.0000 mL | Freq: Once | INTRAVENOUS | Status: AC
  Administered 2018-09-23: 1000 mL via INTRAVENOUS

## 2018-09-23 MED ORDER — PROMETHAZINE HCL 25 MG/ML IJ SOLN: 6.2500 mg | INTRAMUSCULAR | Status: DC | PRN

## 2018-09-23 MED ORDER — ONDANSETRON HCL 4 MG/2ML IJ SOLN
INTRAMUSCULAR | Status: AC
  Filled 2018-09-23: qty 2

## 2018-09-23 MED ORDER — PROPOFOL 10 MG/ML IV BOLUS
INTRAVENOUS | Status: DC | PRN
  Administered 2018-09-23: 30 mg via INTRAVENOUS
  Administered 2018-09-23: 220 mg via INTRAVENOUS

## 2018-09-23 MED ORDER — MIDAZOLAM HCL 2 MG/2ML IJ SOLN
INTRAMUSCULAR | Status: DC | PRN
  Administered 2018-09-23: 2 mg via INTRAVENOUS

## 2018-09-23 MED ORDER — FENTANYL CITRATE (PF) 100 MCG/2ML IJ SOLN
25.0000 ug | INTRAMUSCULAR | Status: DC | PRN
  Administered 2018-09-23: 50 ug via INTRAVENOUS

## 2018-09-23 MED ORDER — LIDOCAINE HCL (PF) 2 % IJ SOLN
INTRAMUSCULAR | Status: AC
  Filled 2018-09-23: qty 10

## 2018-09-23 MED ORDER — OXYCODONE HCL 5 MG PO TABS: 5.0000 mg | ORAL_TABLET | Freq: Once | ORAL | Status: DC | PRN

## 2018-09-23 MED ORDER — PHENYLEPHRINE HCL 10 MG/ML IJ SOLN
INTRAMUSCULAR | Status: AC
  Filled 2018-09-23: qty 1

## 2018-09-23 MED ORDER — SUCCINYLCHOLINE CHLORIDE 20 MG/ML IJ SOLN
INTRAMUSCULAR | Status: AC
  Filled 2018-09-23: qty 1

## 2018-09-23 MED ORDER — SUGAMMADEX SODIUM 500 MG/5ML IV SOLN
INTRAVENOUS | Status: DC | PRN
  Administered 2018-09-23: 240 mg via INTRAVENOUS

## 2018-09-23 MED ORDER — DEXMEDETOMIDINE HCL IN NACL 80 MCG/20ML IV SOLN
INTRAVENOUS | Status: AC
  Filled 2018-09-23: qty 20

## 2018-09-23 MED ORDER — FENTANYL CITRATE (PF) 100 MCG/2ML IJ SOLN
INTRAMUSCULAR | Status: AC
  Filled 2018-09-23: qty 2

## 2018-09-23 MED ORDER — SUCCINYLCHOLINE CHLORIDE 20 MG/ML IJ SOLN
INTRAMUSCULAR | Status: DC | PRN
  Administered 2018-09-23: 120 mg via INTRAVENOUS

## 2018-09-23 MED ORDER — MIDAZOLAM HCL 2 MG/2ML IJ SOLN
INTRAMUSCULAR | Status: AC
  Filled 2018-09-23: qty 2

## 2018-09-23 MED ORDER — OXYCODONE HCL 5 MG PO TABS: 5.0000 mg | ORAL_TABLET | Freq: Four times a day (QID) | ORAL | 0 refills | Status: DC | PRN

## 2018-09-23 MED ORDER — SUGAMMADEX SODIUM 500 MG/5ML IV SOLN
INTRAVENOUS | Status: AC
  Filled 2018-09-23: qty 5

## 2018-09-23 MED ORDER — OXYCODONE HCL 5 MG/5ML PO SOLN: 5.0000 mg | Freq: Once | ORAL | Status: DC | PRN

## 2018-09-23 MED ORDER — FENTANYL CITRATE (PF) 100 MCG/2ML IJ SOLN
INTRAMUSCULAR | Status: AC
  Administered 2018-09-23: 50 ug via INTRAVENOUS
  Filled 2018-09-23: qty 2

## 2018-09-23 SURGICAL SUPPLY — 37 items
APPLIER CLIP 5 13 M/L LIGAMAX5 (MISCELLANEOUS)
BLADE CLIPPER SURG (BLADE) ×2 IMPLANT
CANISTER SUCT 1200ML W/VALVE (MISCELLANEOUS) ×2 IMPLANT
CHLORAPREP W/TINT 26ML (MISCELLANEOUS) ×2 IMPLANT
CLIP APPLIE 5 13 M/L LIGAMAX5 (MISCELLANEOUS) IMPLANT
COVER WAND RF STERILE (DRAPES) IMPLANT
CUTTER FLEX LINEAR 45M (STAPLE) ×2 IMPLANT
DERMABOND ADVANCED (GAUZE/BANDAGES/DRESSINGS) ×1
DERMABOND ADVANCED .7 DNX12 (GAUZE/BANDAGES/DRESSINGS) ×1 IMPLANT
ELECT CAUTERY BLADE 6.4 (BLADE) ×2 IMPLANT
ELECT REM PT RETURN 9FT ADLT (ELECTROSURGICAL) ×2
ELECTRODE REM PT RTRN 9FT ADLT (ELECTROSURGICAL) ×1 IMPLANT
GLOVE BIO SURGEON STRL SZ7 (GLOVE) ×6 IMPLANT
GOWN STRL REUS W/ TWL LRG LVL3 (GOWN DISPOSABLE) ×2 IMPLANT
GOWN STRL REUS W/TWL LRG LVL3 (GOWN DISPOSABLE) ×2
IRRIGATION STRYKERFLOW (MISCELLANEOUS) ×1 IMPLANT
IRRIGATOR STRYKERFLOW (MISCELLANEOUS) ×2
IV NS 1000ML (IV SOLUTION) ×1
IV NS 1000ML BAXH (IV SOLUTION) ×1 IMPLANT
NEEDLE HYPO 22GX1.5 SAFETY (NEEDLE) ×2 IMPLANT
NS IRRIG 500ML POUR BTL (IV SOLUTION) ×2 IMPLANT
PACK LAP CHOLECYSTECTOMY (MISCELLANEOUS) ×2 IMPLANT
PENCIL ELECTRO HAND CTR (MISCELLANEOUS) ×2 IMPLANT
POUCH SPECIMEN RETRIEVAL 10MM (ENDOMECHANICALS) ×2 IMPLANT
RELOAD 45 VASCULAR/THIN (ENDOMECHANICALS) IMPLANT
RELOAD STAPLE TA45 3.5 REG BLU (ENDOMECHANICALS) ×2 IMPLANT
SCISSORS METZENBAUM CVD 33 (INSTRUMENTS) IMPLANT
SHEARS HARMONIC ACE PLUS 36CM (ENDOMECHANICALS) ×2 IMPLANT
SLEEVE ENDOPATH XCEL 5M (ENDOMECHANICALS) ×2 IMPLANT
SPONGE LAP 18X18 RF (DISPOSABLE) ×2 IMPLANT
SUT MNCRL AB 4-0 PS2 18 (SUTURE) ×2 IMPLANT
SUT VICRYL 0 AB UR-6 (SUTURE) ×4 IMPLANT
SYR 20CC LL (SYRINGE) ×2 IMPLANT
TRAY FOLEY MTR SLVR 16FR STAT (SET/KITS/TRAYS/PACK) IMPLANT
TROCAR XCEL BLUNT TIP 100MML (ENDOMECHANICALS) ×2 IMPLANT
TROCAR XCEL NON-BLD 5MMX100MML (ENDOMECHANICALS) ×4 IMPLANT
TUBING INSUF HEATED (TUBING) ×2 IMPLANT

## 2018-09-23 NOTE — Transfer of Care (Signed)
Immediate Anesthesia Transfer of Care Note  Patient: Derek Wheeler.  Procedure(s) Performed: APPENDECTOMY LAPAROSCOPIC (N/A )  Patient Location: PACU  Anesthesia Type:General  Level of Consciousness: drowsy  Airway & Oxygen Therapy: Patient Spontanous Breathing and Patient connected to face mask oxygen  Post-op Assessment: Report given to RN and Post -op Vital signs reviewed and stable  Post vital signs: Reviewed and stable  Last Vitals:  Vitals Value Taken Time  BP 102/53 09/23/2018  8:43 AM  Temp    Pulse 71 09/23/2018  8:44 AM  Resp 16 09/23/2018  8:44 AM  SpO2 96 % 09/23/2018  8:44 AM  Vitals shown include unvalidated device data.  Last Pain:  Vitals:   09/23/18 0509  TempSrc: Oral  PainSc:          Complications: No apparent anesthesia complications

## 2018-09-23 NOTE — Anesthesia Preprocedure Evaluation (Signed)
Anesthesia Evaluation  Patient identified by MRN, date of birth, ID band Patient awake    Reviewed: Allergy & Precautions, NPO status , Patient's Chart, lab work & pertinent test results  History of Anesthesia Complications Negative for: history of anesthetic complications  Airway Mallampati: II  TM Distance: >3 FB Neck ROM: Full    Dental no notable dental hx.    Pulmonary neg pulmonary ROS, neg sleep apnea, neg COPD,    breath sounds clear to auscultation- rhonchi (-) wheezing      Cardiovascular Exercise Tolerance: Good (-) hypertension(-) CAD and (-) Past MI  Rhythm:Regular Rate:Normal - Systolic murmurs and - Diastolic murmurs    Neuro/Psych negative neurological ROS  negative psych ROS   GI/Hepatic negative GI ROS, Neg liver ROS,   Endo/Other  negative endocrine ROSneg diabetes  Renal/GU negative Renal ROS     Musculoskeletal negative musculoskeletal ROS (+)   Abdominal (+) + obese,   Peds  Hematology negative hematology ROS (+)   Anesthesia Other Findings    Reproductive/Obstetrics                             Anesthesia Physical Anesthesia Plan  ASA: II  Anesthesia Plan: General   Post-op Pain Management:    Induction: Intravenous  PONV Risk Score and Plan: 1 and Midazolam and Ondansetron  Airway Management Planned: Oral ETT  Additional Equipment:   Intra-op Plan:   Post-operative Plan: Extubation in OR  Informed Consent: I have reviewed the patients History and Physical, chart, labs and discussed the procedure including the risks, benefits and alternatives for the proposed anesthesia with the patient or authorized representative who has indicated his/her understanding and acceptance.   Dental advisory given  Plan Discussed with: CRNA and Anesthesiologist  Anesthesia Plan Comments:         Anesthesia Quick Evaluation

## 2018-09-23 NOTE — Anesthesia Post-op Follow-up Note (Signed)
Anesthesia QCDR form completed.        

## 2018-09-23 NOTE — Discharge Instructions (Signed)
In addition to included general post-operative instructions for laparoscopic appendectomy (removal of Appendix) ,  Diet: Resume home heart healthy  diet.   Activity: No heavy lifting >20 pounds (children, pets, laundry, garbage) or strenuous activity until follow-up, but light activity and walking are encouraged. Do not drive or drink alcohol if taking narcotic pain medications.  Wound care: 2 days after surgery (Saturday 10/26), you may shower/get incision wet with soapy water and pat dry (do not rub incisions), but no baths or submerging incision underwater until follow-up. Do NOT peel off glue, it will fall off in 7-10 days  Medications: Resume all home medications. For mild to moderate pain: acetaminophen (Tylenol) or ibuprofen/naproxen (if no kidney disease). Combining Tylenol with alcohol can substantially increase your risk of causing liver disease. Narcotic pain medications, if prescribed, can be used for severe pain, though may cause nausea, constipation, and drowsiness. Do not combine Tylenol and Percocet (or similar) within a 6 hour period as Percocet (and similar) contain(s) Tylenol. If you do not need the narcotic pain medication, you do not need to fill the prescription.  Call office [phone]) at any time if any questions, worsening pain, fevers/chills, bleeding, drainage from incision site, or other concerns.

## 2018-09-23 NOTE — Anesthesia Procedure Notes (Signed)
Procedure Name: Intubation Performed by: Lynden Oxford, RN Pre-anesthesia Checklist: Patient identified, Emergency Drugs available, Suction available, Patient being monitored and Timeout performed Patient Re-evaluated:Patient Re-evaluated prior to induction Oxygen Delivery Method: Circle system utilized Preoxygenation: Pre-oxygenation with 100% oxygen Induction Type: IV induction Ventilation: Mask ventilation without difficulty Laryngoscope Size: Miller and 3 Grade View: Grade I Tube type: Oral Tube size: 8.0 mm Number of attempts: 1 Airway Equipment and Method: Stylet

## 2018-09-23 NOTE — Anesthesia Postprocedure Evaluation (Signed)
Anesthesia Post Note  Patient: Derek Wheeler.  Procedure(s) Performed: APPENDECTOMY LAPAROSCOPIC (N/A )  Patient location during evaluation: PACU Anesthesia Type: General Level of consciousness: awake and alert and oriented Pain management: pain level controlled Vital Signs Assessment: post-procedure vital signs reviewed and stable Respiratory status: spontaneous breathing, nonlabored ventilation and respiratory function stable Cardiovascular status: blood pressure returned to baseline and stable Postop Assessment: no signs of nausea or vomiting Anesthetic complications: no     Last Vitals:  Vitals:   09/23/18 0928 09/23/18 1021  BP: (!) 107/55 (!) 86/43  Pulse: 77 80  Resp: 10 20  Temp: (!) 36.1 C (!) 36.3 C  SpO2: 93% 94%    Last Pain:  Vitals:   09/23/18 1021  TempSrc: Oral  PainSc:                  Delfina Schreurs

## 2018-09-23 NOTE — Discharge Summary (Signed)
Discharge Summary  Patient ID: Derek Wheeler [facility]. MRN: [id number] DOB/AGE: 31/25/1987 30 y.o.  Admit date: 09/22/2018 Discharge date: 09/23/2018  Discharge Diagnoses Acute Appendicitis  Consultants None  Procedures Laparoscopic Appendectomy on 09/23/18 with Dr. Everlene Farrier  HPI: Derek Wheeler [facility]. is a 32 y.o. male who presents to the ED this afternoon with abdominal pain. Patient notes that last night after dinner he developed the acute onset of achy RLQ pain which he attributed to eating bad food. The pain persisted into this morning without radiating anywhere prompted him to go to UC. At [facility], there was concern for appendicitis and he was sent to the ED for evaluation. He notes that the pain has note subsided and he has associated nausea and decreased appetite. He denied any fevers, chills, CP, SOB, emesis, diarrhea, urinary changes, or peripheral edema. No history of similar pain. Denied any history of previous abdominal surgeries. Work up in the ED revealed a leukocytosis and enlarged proximal appendix.  Hospital Course: Derek Wheeler. was admitted to general surgery on 10/23 with plans for laparoscopic appendectomy the following morning. Patient underwent uneventful laparoscopic appendectomy (Dr. Everlene Farrier, 09/23/2018).  Post-operatively, patient's pain improved/resolved and advancement of patient's diet and ambulation were well-tolerated. The remainder of patient's hospital course was essentially unremarkable, and discharge planning was initiated accordingly with patient safely able to be discharged home with appropriate discharge instructions, pain control, and outpatient follow-up after all of his questions were answered to his expressed satisfaction.   Allergies as of 09/23/2018   No Known Allergies     Medication List    TAKE these medications   clindamycin-benzoyl peroxide gel Commonly known as:  BENZACLIN Apply topically 2 (two) times daily.   clobetasol 0.05 % external  solution Commonly known as:  TEMOVATE Apply 1 application topically 2 (two) times daily.   Fish Oil 1000 MG Caps Take 1,000 mg by mouth daily.   hydrocortisone 2.5 % lotion Apply 1 application topically 2 (two) times daily.   loratadine 10 MG tablet Commonly known as:  CLARITIN Take 10 mg by mouth daily.   magnesium oxide 400 MG tablet Commonly known as:  MAG-OX Take 400 mg by mouth daily.   ORACEA 40 MG capsule Generic drug:  doxycycline Take 40 mg by mouth daily.   oxyCODONE 5 MG immediate release tablet Commonly known as:  Oxy IR/ROXICODONE Take 1 tablet (5 mg total) by mouth every 6 (six) hours as needed for severe pain or breakthrough pain.   phentermine 37.5 MG tablet Commonly known as:  ADIPEX-P Take 1 tablet (37.5 mg total) by mouth daily before breakfast.   SOOLANTRA 1 % Crea Generic drug:  Ivermectin Apply 1 application topically daily.   tretinoin 0.025 % cream Commonly known as:  RETIN-A Apply topically at bedtime.   triamcinolone lotion 0.1 % Commonly known as:  KENALOG Apply 1 application topically as directed.        Follow-up Information    Pabon, [place] F, MD. Schedule an appointment as soon as possible for a visit on 10/06/2018.   Specialty:  General Surgery Why:  at 11:15 Contact information: [address] [place] [address] [phone]           Signed: Lynden Oxford , PA-C [facility]  09/23/2018, 2:13 PM [phone] M-F: 7am - 4pm

## 2018-09-23 NOTE — Op Note (Signed)
laparascopic appendectomy   [name]. Date of operation:  09/23/2018  Indications: The patient presented with a history of  abdominal pain. Equivocal findings of appendicitis.  Pre-operative Diagnosis: Acute appendicitis without mention of peritonitis  Post-operative Diagnosis: Same  Surgeon: Sterling Big, MD, FACS  Anesthesia: General with endotracheal tube  Findings: Acute Appendicitis  Estimated Blood Loss: 5cc         Specimens: appendix         Complications:  none  Procedure Details  The patient was seen again in the preop area. The options of surgery versus observation were reviewed with the patient and/or family. The risks of bleeding, infection, recurrence of symptoms, negative laparoscopy, potential for an open procedure, bowel injury, abscess or infection, were all reviewed as well. The patient was taken to Operating Room, identified as [facility]. and the procedure verified as laparoscopic appendectomy. A Time Out was held and the above information confirmed.  The patient was placed in the supine position and general anesthesia was induced.  Antibiotic prophylaxis was administered and VT E prophylaxis was in place. A Foley catheter was placed by the nursing staff.   The abdomen was prepped and draped in a sterile fashion. An infraumbilical incision was made. A cutdown technique was used to enter the abdominal cavity. Two vicryl stitches were placed on the fascia and a Hasson trocar inserted. Pneumoperitoneum obtained. Two 5 mm ports were placed under direct visualization.   The appendix was identified and found to be acutely inflamed  The appendix was carefully dissected. The mesoappendix was divided withHarmonic scalpel. The base of the appendix was dissected out and divided with a standard load Endo GIA.The appendix was placed in a Endo Catch bag and removed via the Hasson port. The right lower quadrant and pelvis was then irrigated with  normal saline which  was aspirated. Inspection  failed to identify any additional bleeding and there were no signs of bowel injury. Again the right lower quadrant was inspected there was no sign of bleeding or bowel injury therefore pneumoperitoneum was released, all ports were removed.  The umbilical fascia was closed with 0 Vicryl interrupted sutures and the skin incisions were approximated with subcuticular 4-0 Monocryl. Dermabond was placed The patient tolerated the procedure well, there were no complications. The sponge lap and needle count were correct at the end of the procedure.  The patient was taken to the recovery room in stable condition to be admitted for continued care.    Sterling Big, MD FACS

## 2018-09-24 LAB — HIV ANTIBODY (ROUTINE TESTING W REFLEX): HIV Screen 4th Generation wRfx: NONREACTIVE

## 2018-09-24 NOTE — Addendum Note (Signed)
Addendum  created 09/24/18 1641 by Stormy Fabian, CRNA   Charge Capture section accepted

## 2018-09-27 LAB — SURGICAL PATHOLOGY

## 2018-10-04 ENCOUNTER — Encounter: Payer: Self-pay | Admitting: Surgery

## 2018-10-04 ENCOUNTER — Ambulatory Visit (INDEPENDENT_AMBULATORY_CARE_PROVIDER_SITE_OTHER): Payer: 59 | Admitting: Surgery

## 2018-10-04 ENCOUNTER — Other Ambulatory Visit: Payer: Self-pay

## 2018-10-04 VITALS — BP 139/91 | HR 85 | Temp 97.9°F | Ht 70.0 in | Wt 251.8 lb

## 2018-10-04 DIAGNOSIS — K3589 Other acute appendicitis without perforation or gangrene: Secondary | ICD-10-CM

## 2018-10-04 NOTE — Progress Notes (Signed)
S/p lap appy Doing very well Minimal soreness Taking PO, no fevers  PE NAD GNF:AOZH, nt, incisions c/d/i, no infection  A/P Doing very well RTC prn No heavy lifting for a few more weeks

## 2018-10-04 NOTE — Patient Instructions (Signed)
Patient will return to the office as needed. He has been informed to call the office with any questions or concerns.

## 2018-10-06 ENCOUNTER — Encounter: Payer: 59 | Admitting: Surgery

## 2018-11-10 ENCOUNTER — Encounter: Payer: Self-pay | Admitting: Physician Assistant

## 2018-11-10 ENCOUNTER — Encounter: Payer: Self-pay | Admitting: Family Medicine

## 2018-11-10 ENCOUNTER — Ambulatory Visit: Payer: Self-pay | Admitting: Family Medicine

## 2018-11-10 ENCOUNTER — Ambulatory Visit (INDEPENDENT_AMBULATORY_CARE_PROVIDER_SITE_OTHER): Payer: 59 | Admitting: Physician Assistant

## 2018-11-10 VITALS — BP 117/81 | HR 71 | Temp 98.0°F | Resp 16 | Ht 70.0 in | Wt 255.0 lb

## 2018-11-10 VITALS — BP 128/75 | HR 95 | Ht 71.25 in | Wt 255.9 lb

## 2018-11-10 DIAGNOSIS — Z0289 Encounter for other administrative examinations: Secondary | ICD-10-CM

## 2018-11-10 DIAGNOSIS — E6609 Other obesity due to excess calories: Secondary | ICD-10-CM

## 2018-11-10 DIAGNOSIS — Z6836 Body mass index (BMI) 36.0-36.9, adult: Secondary | ICD-10-CM

## 2018-11-10 DIAGNOSIS — Z029 Encounter for administrative examinations, unspecified: Secondary | ICD-10-CM

## 2018-11-10 DIAGNOSIS — Z713 Dietary counseling and surveillance: Secondary | ICD-10-CM

## 2018-11-10 NOTE — Progress Notes (Signed)
BP 128/75 (BP Location: Left Arm, Patient Position: Sitting, Cuff Size: Large)   Pulse 95   Ht 5' 11.25" (1.81 m)   Wt 255 lb 14.4 oz (116.1 kg)   BMI 35.44 kg/m    Subjective:    Patient ID: Derek GheeGary W Cecere Jr., male    DOB: 10/04/1986, 32 y.o.   MRN: [id number]  HPI: Derek GheeGary W Capitano Jr. is a 32 y.o. 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  Getting ready to start flight training. On medicine review patient taking phentermine for weight loss discussed that that is not an approved medication and patient is going to stop medication. Taking other medications for acne without problems. Relevant past medical, surgical, family and social history reviewed and updated as indicated. Interim medical history since our last visit reviewed. Allergies and medications reviewed and updated.  Review of Systems  Constitutional: Negative.   HENT: Negative.   Eyes: Negative.   Respiratory: Negative.   Cardiovascular: Negative.   Gastrointestinal: Negative.   Endocrine: Negative.   Genitourinary: Negative.   Musculoskeletal: Negative.   Skin: Negative.   Allergic/Immunologic: Negative.   Neurological: Negative.   Hematological: Negative.   Psychiatric/Behavioral: Negative.     Per HPI unless specifically indicated above     Objective:    BP 128/75 (BP Location: Left Arm, Patient Position: Sitting, Cuff Size: Large)   Pulse 95   Ht 5' 11.25" (1.81 m)   Wt 255 lb 14.4 oz (116.1 kg)   BMI 35.44 kg/m   Wt Readings from Last 3 Encounters:  11/10/18 255 lb 14.4 oz (116.1 kg)  10/04/18 251 lb 12.8 oz (114.2 kg)  09/22/18 250 lb (113.4 kg)    Physical Exam  Constitutional: He is oriented to person, place, and time. He appears well-developed and well-nourished.  HENT:  Head: Normocephalic.  Right Ear: External ear normal.  Left Ear: External ear normal.  Nose: Nose normal.    Eyes: Pupils are equal, round, and reactive to light. Conjunctivae and EOM are normal.  Neck: Normal range of motion. Neck supple. No thyromegaly present.  Cardiovascular: Normal rate, regular rhythm, normal heart sounds and intact distal pulses.  Pulmonary/Chest: Effort normal and breath sounds normal.  Abdominal: Soft. Bowel sounds are normal. There is no splenomegaly or hepatomegaly.  Genitourinary: Penis normal.  Musculoskeletal: Normal range of motion.  Lymphadenopathy:    He has no cervical adenopathy.  Neurological: He is alert and oriented to person, place, and time. He has normal reflexes.  Skin: Skin is warm and dry.  Psychiatric: He has a normal mood and affect. His behavior is normal. Judgment and thought content normal.    Results for orders placed or performed during the hospital encounter of 09/22/18  Surgical PCR screen  Result Value Ref Range   MRSA, PCR NEGATIVE NEGATIVE   Staphylococcus aureus NEGATIVE NEGATIVE  Basic metabolic panel  Result Value Ref Range   Sodium 141 135 - 145 mmol/L   Potassium 3.9 3.5 - 5.1 mmol/L   Chloride 106 98 - 111 mmol/L   CO2 27 22 - 32 mmol/L   Glucose, Bld 99 70 - 99 mg/dL   BUN 16 6 - 20 mg/dL   Creatinine, Ser 1.321.09 0.61 - 1.24 mg/dL   Calcium 9.7 8.9 - 44.010.3 mg/dL   GFR calc non Af Amer >60 >60 mL/min   GFR calc Af  Amer >60 >60 mL/min   Anion gap 8 5 - 15  CBC with Differential  Result Value Ref Range   WBC 10.6 (H) 4.0 - 10.5 K/uL   RBC 5.32 4.22 - 5.81 MIL/uL   Hemoglobin 15.4 13.0 - 17.0 g/dL   HCT 16.1 09.6 - 04.5 %   MCV 85.2 80.0 - 100.0 fL   MCH 28.9 26.0 - 34.0 pg   MCHC 34.0 30.0 - 36.0 g/dL   RDW 40.9 81.1 - 91.4 %   Platelets 365 150 - 400 K/uL   nRBC 0.0 0.0 - 0.2 %   Neutrophils Relative % 64 %   Neutro Abs 6.7 1.7 - 7.7 K/uL   Lymphocytes Relative 27 %   Lymphs Abs 2.9 0.7 - 4.0 K/uL   Monocytes Relative 8 %   Monocytes Absolute 0.9 0.1 - 1.0 K/uL   Eosinophils Relative 0 %   Eosinophils Absolute  0.0 0.0 - 0.5 K/uL   Basophils Relative 1 %   Basophils Absolute 0.1 0.0 - 0.1 K/uL   Immature Granulocytes 0 %   Abs Immature Granulocytes 0.04 0.00 - 0.07 K/uL  HIV antibody (Routine Testing)  Result Value Ref Range   HIV Screen 4th Generation wRfx Non Reactive Non Reactive  Basic metabolic panel  Result Value Ref Range   Sodium 140 135 - 145 mmol/L   Potassium 4.4 3.5 - 5.1 mmol/L   Chloride 108 98 - 111 mmol/L   CO2 26 22 - 32 mmol/L   Glucose, Bld 120 (H) 70 - 99 mg/dL   BUN 15 6 - 20 mg/dL   Creatinine, Ser 7.82 0.61 - 1.24 mg/dL   Calcium 8.6 (L) 8.9 - 10.3 mg/dL   GFR calc non Af Amer >60 >60 mL/min   GFR calc Af Amer >60 >60 mL/min   Anion gap 6 5 - 15  CBC  Result Value Ref Range   WBC 10.8 (H) 4.0 - 10.5 K/uL   RBC 4.59 4.22 - 5.81 MIL/uL   Hemoglobin 13.1 13.0 - 17.0 g/dL   HCT 95.6 21.3 - 08.6 %   MCV 86.1 80.0 - 100.0 fL   MCH 28.5 26.0 - 34.0 pg   MCHC 33.2 30.0 - 36.0 g/dL   RDW 57.8 46.9 - 62.9 %   Platelets 315 150 - 400 K/uL   nRBC 0.0 0.0 - 0.2 %  Surgical pathology  Result Value Ref Range   SURGICAL PATHOLOGY      Surgical Pathology CASE: [id number] PATIENT: Sarina Ill Surgical Pathology Report     SPECIMEN SUBMITTED: A. Appendix  CLINICAL HISTORY: None provided  PRE-OPERATIVE DIAGNOSIS: Acute appendicitis  POST-OPERATIVE DIAGNOSIS: Same as pre-op     DIAGNOSIS: A. APPENDIX; APPENDECTOMY: - ACUTE APPENDICITIS. - NEGATIVE FOR MALIGNANCY.  GROSS DESCRIPTION: A. Labeled: Appendix Received: In formalin Size: 8.2 x 0.9 x 0.8 cm External surface: Purple-tan with fibrous adhesion and focal chalky white material Perforation: None grossly identified Fecalith: No Description: The proximal margin is inked blue.  The wall thickness is up to 0.5 cm  Block summary: 1 - representative cross-section, perpendicular margin and longitudinal tip   Final Diagnosis performed by Elijah Birk, MD.   Electronically signed 09/27/2018  9:51:54AM The electronic signature indicates that the named Attending Pathologist has evaluated the specimen  Technical component performed  at [facility], [address] [place], [place] [address] Lab: [phone] Dir: Jolene Schimke, MD, MMM  Professional component performed at [facility], [facility], [address] [place], [place]  [address] Lab: [phone] Dir: Georgiann Cocker. 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

## 2018-11-10 NOTE — Progress Notes (Signed)
Patient: Derek GheeGary W Rew Jr. Male    DOB: 11/17/1986   32 y.o.   MRN: [id number] Visit Date: 11/10/2018  Today's Provider: Margaretann LovelessJennifer M Adriene Knipfer, PA-C   Chief Complaint  Patient presents with  . Follow-up   Subjective:    HPI  Follow up for obesity  The patient was last seen for this 8 months ago. Changes made at last visit include continue phentermine.  He reports good compliance with treatment. He feels that condition is Unchanged. He is not having side effects.   Wt Readings from Last 3 Encounters:  11/10/18 255 lb (115.7 kg)  11/10/18 255 lb 14.4 oz (116.1 kg)  10/04/18 251 lb 12.8 oz (114.2 kg)   ------------------------------------------------------------------------------------       No Known Allergies   Current Outpatient Medications:  .  clindamycin-benzoyl peroxide (BENZACLIN) gel, Apply topically 2 (two) times daily., Disp: 50 g, Rfl: 1 .  clobetasol (TEMOVATE) 0.05 % external solution, Apply 1 application topically 2 (two) times daily., Disp: 50 mL, Rfl: 0 .  hydrocortisone 2.5 % lotion, Apply 1 application topically 2 (two) times daily., Disp: , Rfl: 2 .  loratadine (CLARITIN) 10 MG tablet, Take 10 mg by mouth daily., Disp: , Rfl:  .  magnesium oxide (MAG-OX) 400 MG tablet, Take 400 mg by mouth daily., Disp: , Rfl:  .  Omega-3 Fatty Acids (FISH OIL) 1000 MG CAPS, Take 1,000 mg by mouth daily. , Disp: , Rfl:  .  ORACEA 40 MG capsule, Take 40 mg by mouth daily., Disp: , Rfl: 2 .  phentermine (ADIPEX-P) 37.5 MG tablet, Take 1 tablet (37.5 mg total) by mouth daily before breakfast., Disp: 30 tablet, Rfl: 2 .  SOOLANTRA 1 % CREA, Apply 1 application topically daily., Disp: , Rfl: 2 .  tretinoin (RETIN-A) 0.025 % cream, Apply topically at bedtime., Disp: 45 g, Rfl: 0 .  triamcinolone lotion (KENALOG) 0.1 %, Apply 1 application topically as directed., Disp: , Rfl: 2  Review of Systems  Constitutional: Negative.   Respiratory: Negative.   Cardiovascular:  Negative.   Gastrointestinal: Negative.   Neurological: Negative.     Social History   Tobacco Use  . Smoking status: Never Smoker  . Smokeless tobacco: Never Used  Substance Use Topics  . Alcohol use: Yes    Alcohol/week: 2.0 standard drinks    Types: 2 Cans of beer per week    Comment: on weekends   Objective:   BP 117/81 (BP Location: Left Arm, Patient Position: Sitting, Cuff Size: Normal)   Pulse 71   Temp 98 F (36.7 C) (Oral)   Resp 16   Ht 5\' 10"  (1.778 m)   Wt 255 lb (115.7 kg)   BMI 36.59 kg/m  Vitals:   11/10/18 1219  BP: 117/81  Pulse: 71  Resp: 16  Temp: 98 F (36.7 C)  TempSrc: Oral  Weight: 255 lb (115.7 kg)  Height: 5\' 10"  (1.778 m)     Physical Exam  Constitutional: He appears well-developed and well-nourished. No distress.  HENT:  Head: Normocephalic and atraumatic.  Eyes: Conjunctivae and EOM are normal.  Neck: Normal range of motion. Neck supple.  Pulmonary/Chest: Effort normal. No respiratory distress.  Psychiatric: He has a normal mood and affect. His behavior is normal. Judgment and thought content normal.  Vitals reviewed.       Assessment & Plan:     1. 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 Long discussion over different dieting techniques. Decided will try intermittent fasting.   2. Class 2 obesity due to excess calories without serious comorbidity with body mass index (BMI) of 36.0 to 36.9 in adult See above medical treatment plan.       Margaretann Loveless, PA-C  [facility]

## 2019-03-16 DIAGNOSIS — Z86018 Personal history of other benign neoplasm: Secondary | ICD-10-CM

## 2019-03-20 IMAGING — CT CT ABD-PELV W/ CM
2 of 4 series · 16 of 46 positions shown, 18 images · IV contrast (APPLIED)
Comparison: None.

CLINICAL DATA: Acute right lower quadrant abdominal pain.

EXAM:
CT ABDOMEN AND PELVIS WITH CONTRAST
TECHNIQUE: Multidetector CT imaging of the abdomen and pelvis was performed
using the standard protocol following bolus administration of
intravenous contrast.
CONTRAST:  100mL YKAUVD-BYY IOPAMIDOL (YKAUVD-BYY) INJECTION 61%

[Series 2: axial st · axial · 0.75mm/px · z∈[-564,-69]mm · 13 of 109 slices shown, 15 images]
[im 5/109  soft-tissue]
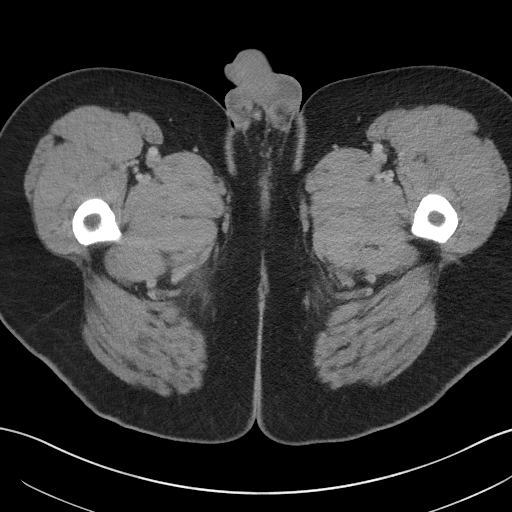
[im 5/109  bone]
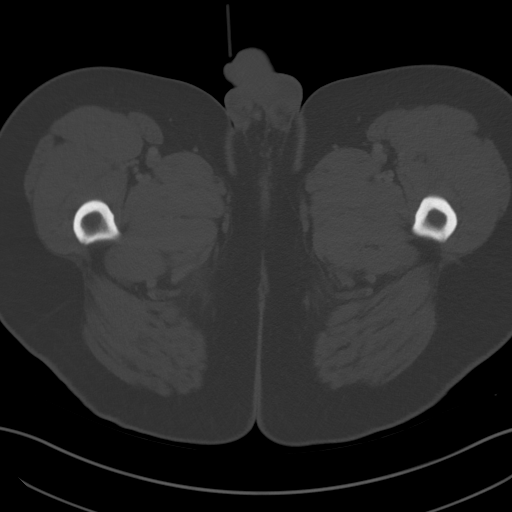
[im 15/109  soft-tissue]
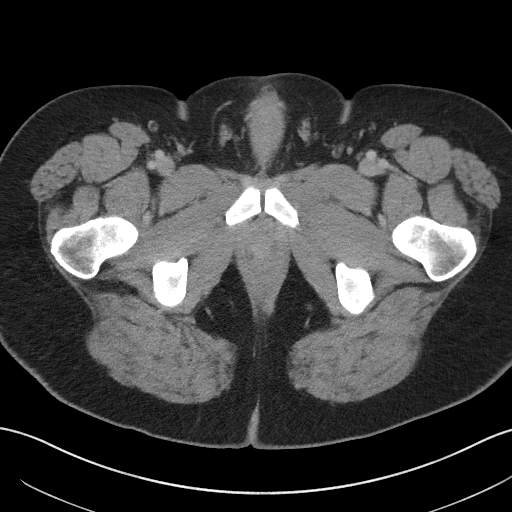
[im 25/109  soft-tissue]
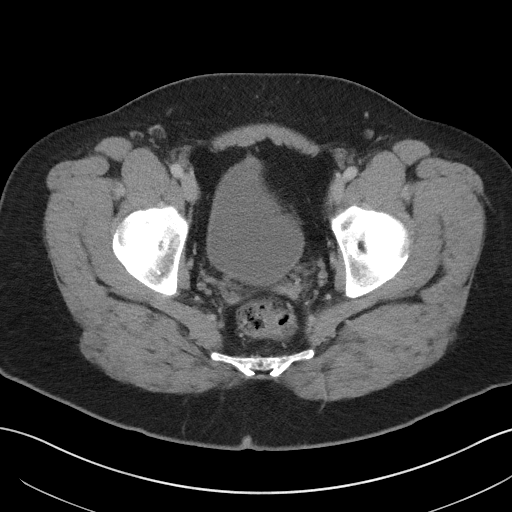
[im 30/109  soft-tissue]
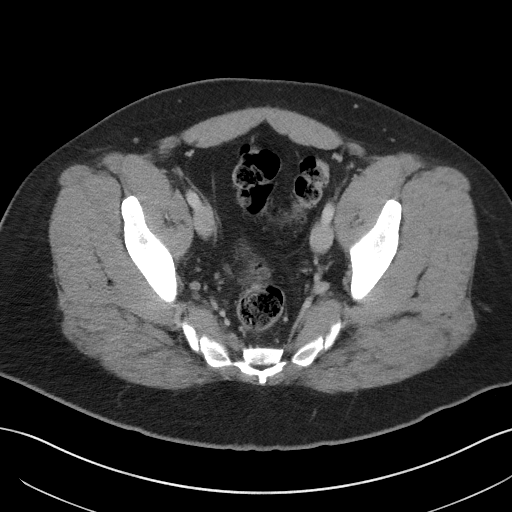
[im 40/109  soft-tissue]
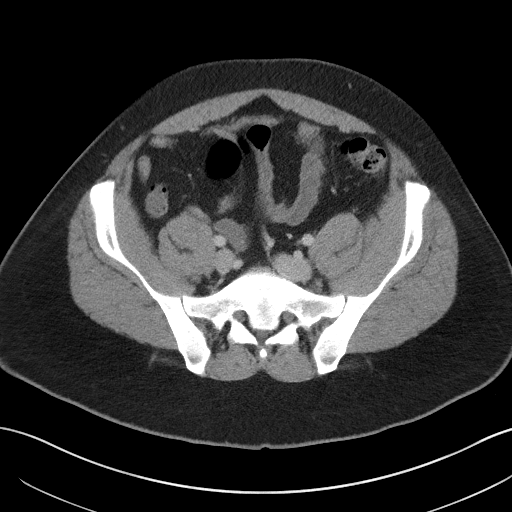
[im 45/109  soft-tissue]
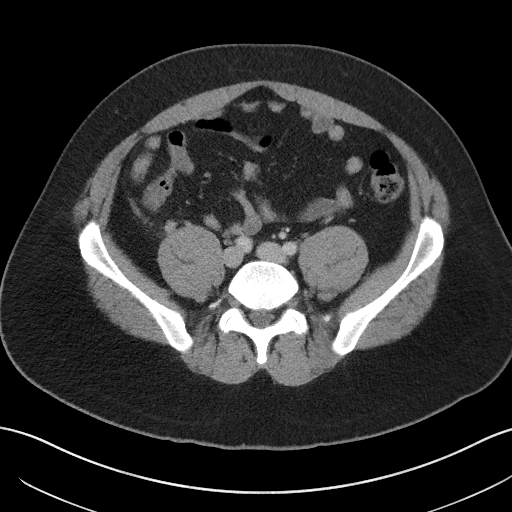
[im 55/109  soft-tissue]
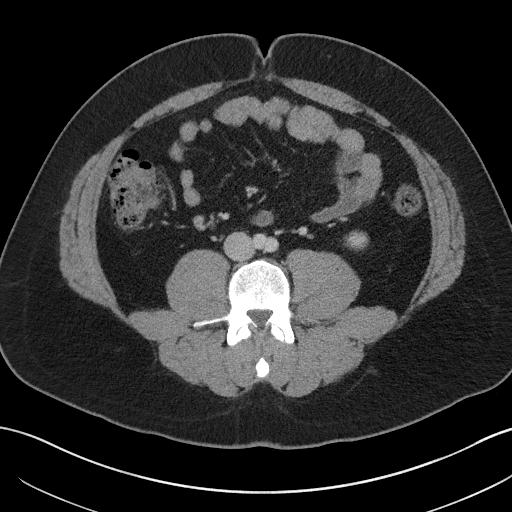
[im 64/109  soft-tissue]
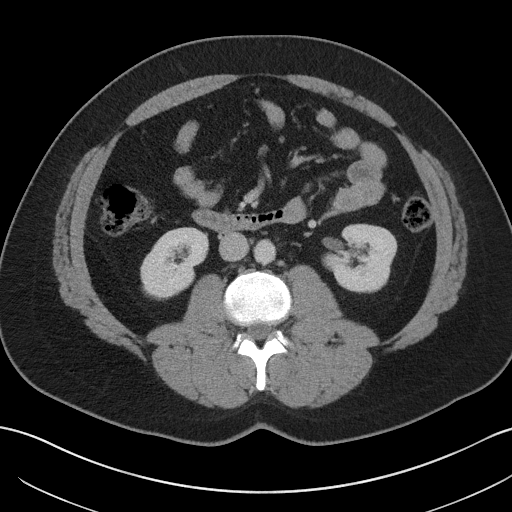
[im 69/109  soft-tissue]
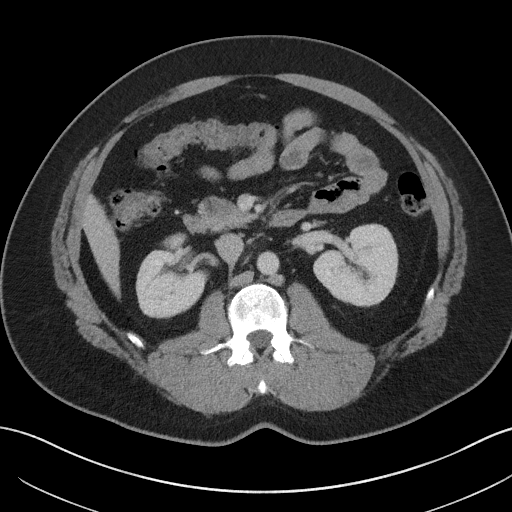
[im 69/109  bone]
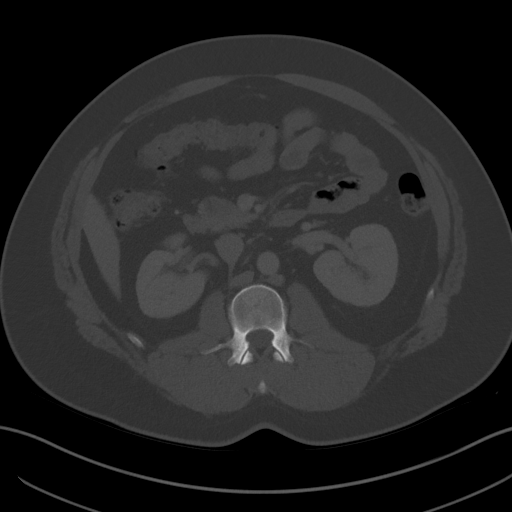
[im 79/109  soft-tissue]
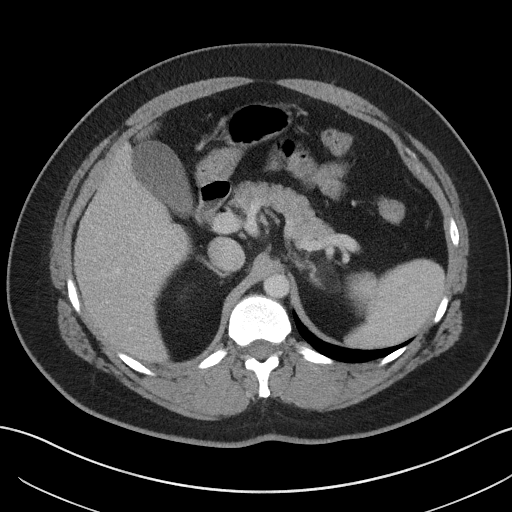
[im 84/109  soft-tissue]
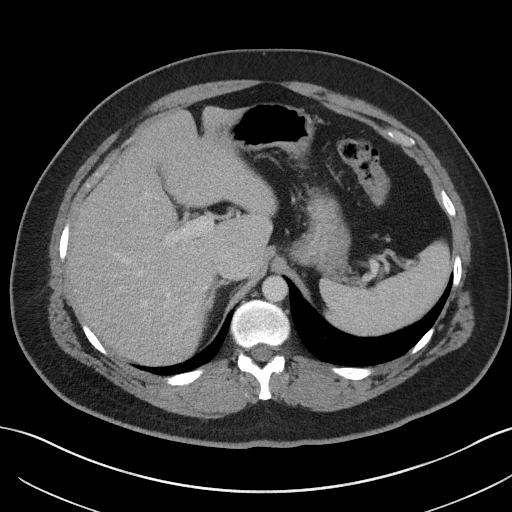
[im 94/109  soft-tissue]
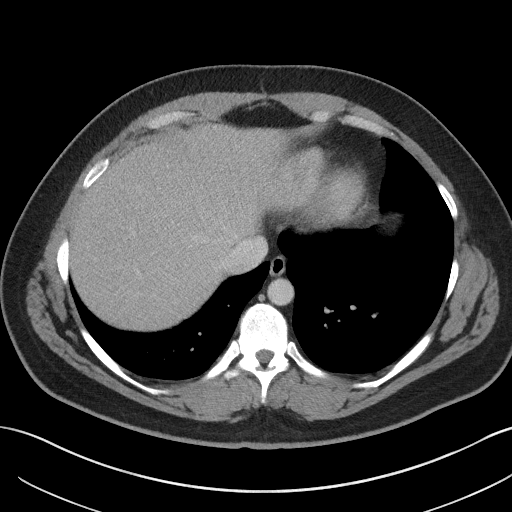
[im 104/109  soft-tissue]
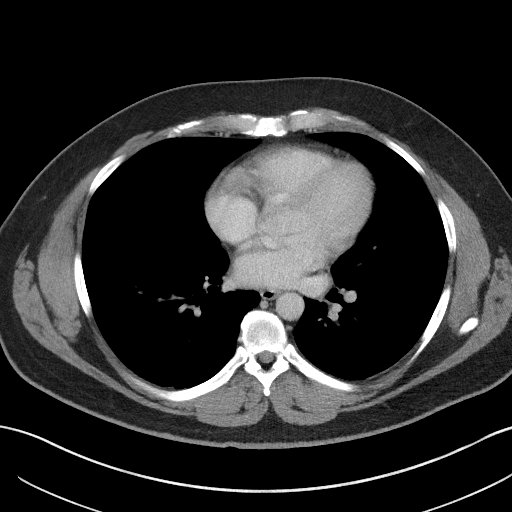

[Series 5: coronal st · coronal · 0.74mm/px · 3 of 101 slices shown]
[im 34/101  soft-tissue]
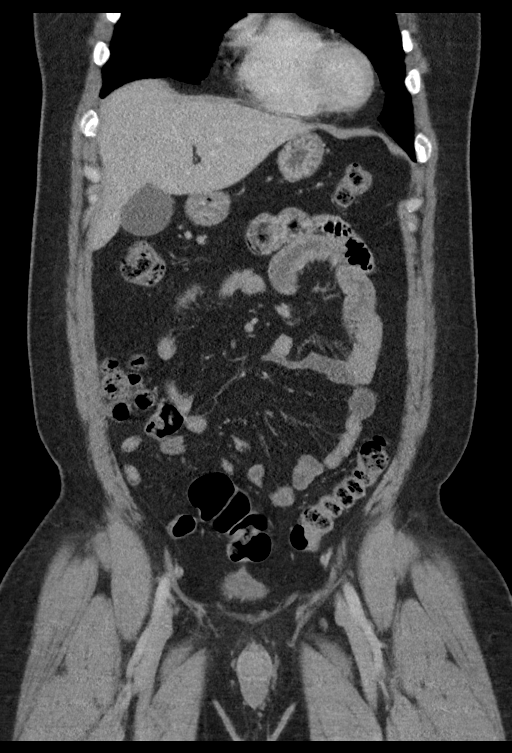
[im 45/101  soft-tissue]
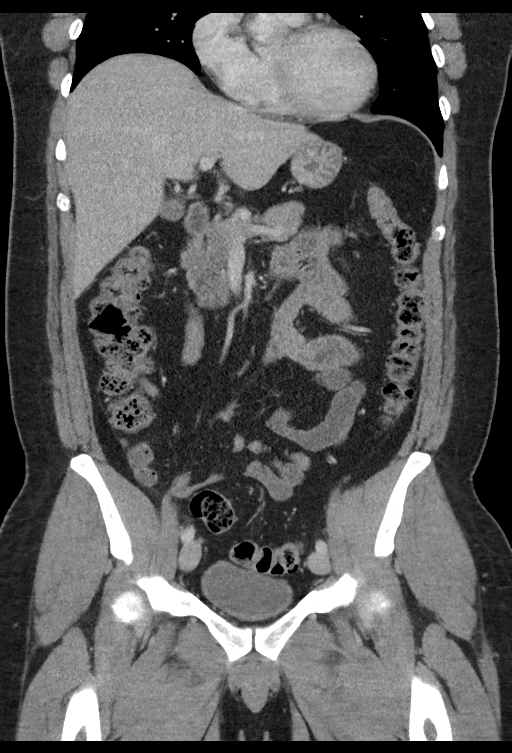
[im 56/101  soft-tissue]
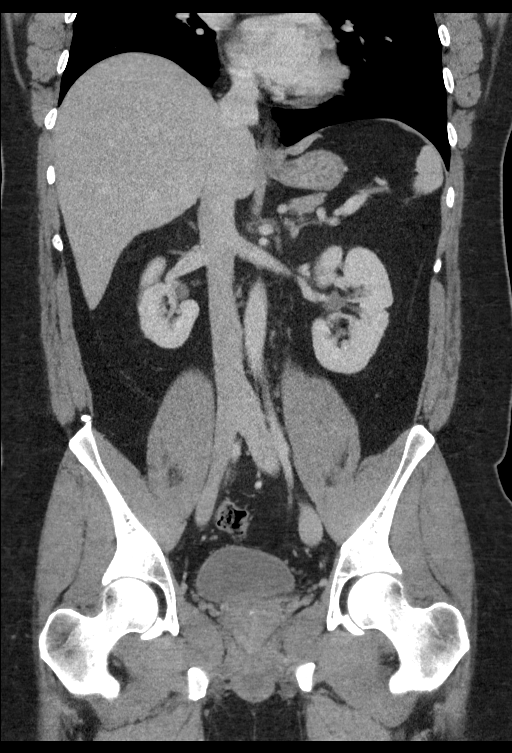

[16 of 46 positions shown; findings below may reference images not displayed]

FINDINGS: Lower chest: Normal.

Hepatobiliary: No focal liver abnormality is seen. No gallstones,
gallbladder wall thickening, or biliary dilatation.

Pancreas: Unremarkable. No pancreatic ductal dilatation or
surrounding inflammatory changes.

Spleen: Normal in size without focal abnormality.

Adrenals/Urinary Tract: Adrenal glands are unremarkable. Kidneys are
normal, without renal calculi, focal lesion, or hydronephrosis.
Bladder is unremarkable.

Stomach/Bowel: There is slight prominence of the proximal appendix
to a diameter of 8 mm. No periappendiceal soft tissue inflammation.
The bowel otherwise appears normal.

Vascular/Lymphatic: No significant vascular findings are present. No
enlarged abdominal or pelvic lymph nodes.

Reproductive: Prostate is unremarkable.

Other: No abdominal wall hernia or abnormality. No abdominopelvic
ascites.

Musculoskeletal: No acute or significant osseous findings.
IMPRESSION: 1. Minimal prominence of the proximal appendix without
periappendiceal inflammation. No fecalith. The appearance is
indeterminate.
2. Otherwise benign appearing abdomen and pelvis.

## 2020-06-06 ENCOUNTER — Telehealth: Payer: Self-pay | Admitting: Physician Assistant

## 2020-06-06 NOTE — Telephone Encounter (Signed)
Patient stopped by front office asking for an appointment today with anyone.  No one had any open appointments. Patient having stomach pain for 3 days.  Expressed that lifting boxes at work has caused stomach to feel worse by burping and acid reflux feeling.  He is going to the [facility] in [place] to be seen today.  Thanks, [employer]

## 2020-06-06 NOTE — Telephone Encounter (Signed)
Looks like patient has scheduled an appointment to see Derek Wheeler tomorrow at 2:40pm.

## 2020-06-06 NOTE — Progress Notes (Signed)
Established patient visit   Patient: Derek Wheeler.   DOB: 1986-01-09   33 y.o. Male  MRN: [id number] Visit Date: 06/07/2020  Today's healthcare provider: Dortha Kern, PA   Chief Complaint  Patient presents with   Abdominal Pain   Velora Mediate as a scribe for [employer], PA.,have documented all relevant documentation on the behalf of Dortha Kern, PA,as directed by  [employer], PA while in the presence of [employer], [place].  Subjective    Abdominal Pain This is a new problem. Episode onset: 4 days ago  The onset quality is sudden. The problem occurs intermittently. The problem has been gradually improving. The pain is located in the epigastric region. The pain is aggravated by eating (lifting). The pain is relieved by sitting up (not eating). Treatments tried: Omeprazole for 3 days  The treatment provided no relief.  Some nausea without vomiting after eating. No diarrhea or melena.   Patient Active Problem List   Diagnosis Date Noted   Acute appendicitis    Hx of dysplastic nevus 09/10/2018   History of Salmonella infection 03/08/2018   Second hand smoke exposure 03/08/2018   Class 2 obesity with body mass index (BMI) of 36.0 to 36.9 in adult 11/03/2017   Hyperlipidemia 11/03/2017   Right foot pain 11/03/2017   Past Medical History:  Diagnosis Date   Hx of dysplastic nevus 09/10/2018   R upper back, moderate atypia   Past Surgical History:  Procedure Laterality Date   LAPAROSCOPIC APPENDECTOMY N/A 09/23/2018   Procedure: APPENDECTOMY LAPAROSCOPIC;  Surgeon: Leafy Ro, MD;  Location: [facility] ORS;  Service: General;  Laterality: N/A;   NO PAST SURGERIES     Social History   Tobacco Use   Smoking status: Never Smoker   Smokeless tobacco: Never Used  Vaping Use   Vaping Use: Never used  Substance Use Topics   Alcohol use: Yes    Alcohol/week: 2.0 standard drinks    Types: 2 Cans of beer per week    Comment: on weekends   Drug  use: No   Family Status  Relation Name Status   Mother  Alive   Father  Alive   MGF  Deceased   No Known Allergies     Medications: Outpatient Medications Prior to Visit  Medication Sig   clindamycin-benzoyl peroxide (BENZACLIN) gel Apply topically 2 (two) times daily.   clobetasol (TEMOVATE) 0.05 % external solution Apply 1 application topically 2 (two) times daily.   hydrocortisone 2.5 % lotion Apply 1 application topically 2 (two) times daily.   loratadine (CLARITIN) 10 MG tablet Take 10 mg by mouth daily.   magnesium oxide (MAG-OX) 400 MG tablet Take 400 mg by mouth daily.   Omega-3 Fatty Acids (FISH OIL) 1000 MG CAPS Take 1,000 mg by mouth daily.    ORACEA 40 MG capsule Take 40 mg by mouth daily.   phentermine (ADIPEX-P) 37.5 MG tablet Take 1 tablet (37.5 mg total) by mouth daily before breakfast.   SOOLANTRA 1 % CREA Apply 1 application topically daily.   tretinoin (RETIN-A) 0.025 % cream Apply topically at bedtime.   triamcinolone lotion (KENALOG) 0.1 % Apply 1 application topically as directed.   No facility-administered medications prior to visit.   Review of Systems  Constitutional: Negative.   Respiratory: Negative.   Cardiovascular: Negative.   Gastrointestinal: Positive for abdominal pain.  Musculoskeletal: Negative.     Objective    BP 138/85 (BP Location: Right Arm, Patient  Position: Sitting, Cuff Size: Large)   Pulse 96   Temp (!) 97.1 F (36.2 C) (Temporal)   Wt 270 lb (122.5 kg)   BMI 38.74 kg/m  BP Readings from Last 3 Encounters:  06/07/20 138/85  11/10/18 117/81  11/10/18 128/75   Wt Readings from Last 3 Encounters:  06/07/20 270 lb (122.5 kg)  11/10/18 255 lb (115.7 kg)  11/10/18 255 lb 14.4 oz (116.1 kg)   Physical Exam   General: Appearance:    Obese male in no acute distress  Eyes:    PERRL, conjunctiva/corneas clear, EOM's intact       Lungs:     Clear to auscultation bilaterally, respirations unlabored  Heart:    Normal heart rate.  Normal rhythm. No murmurs, rubs, or gallops.   MS:   All extremities are intact.   Neurologic:   Awake, alert, oriented x 3. No apparent focal neurological           defect.       No results found for any visits on 06/07/20.  Assessment & Plan     1. Pain of upper abdomen Onset over the past 4 days. No vomiting or diarrhea. Some nausea after eating. Discomfort in the epigastric region with out radiation, rebound or rigidity. Will check labs for H. Pylori, pancreatitis, signs of infection or liver/GB disease. May use the Omeprazole 20 mg qd regularly. May need referral to GI if persistent. If worsening, should go to the ER. - CBC with Differential/Platelet - Comprehensive metabolic panel - Lipase - Amylase - H. pylori breath test - omeprazole (PRILOSEC) 20 MG capsule; Take 1 capsule (20 mg total) by mouth daily.  Dispense: 30 capsule; Refill: 3   No follow-ups on file.      Haywood Pao, PA, have reviewed all documentation for this visit. The documentation on 06/07/20 for the exam, diagnosis, procedures, and orders are all accurate and complete.    Dortha Kern, PA  [facility] [phone] (phone) [phone] (fax)  [facility]

## 2020-06-07 ENCOUNTER — Encounter: Payer: Self-pay | Admitting: Family Medicine

## 2020-06-07 ENCOUNTER — Other Ambulatory Visit: Payer: Self-pay

## 2020-06-07 ENCOUNTER — Ambulatory Visit (INDEPENDENT_AMBULATORY_CARE_PROVIDER_SITE_OTHER): Payer: 59 | Admitting: Family Medicine

## 2020-06-07 VITALS — BP 138/85 | HR 96 | Temp 97.1°F | Wt 270.0 lb

## 2020-06-07 DIAGNOSIS — R1010 Upper abdominal pain, unspecified: Secondary | ICD-10-CM | POA: Diagnosis not present

## 2020-06-07 MED ORDER — OMEPRAZOLE 20 MG PO CPDR: 20.0000 mg | DELAYED_RELEASE_CAPSULE | Freq: Every day | ORAL | 3 refills | Status: DC

## 2020-06-07 NOTE — Patient Instructions (Signed)
Gastritis, Adult Gastritis is inflammation of the stomach. There are two kinds of gastritis:  Acute gastritis. This kind develops suddenly.  Chronic gastritis. This kind is much more common and lasts for a long time. Gastritis happens when the lining of the stomach becomes weak or gets damaged. Without treatment, gastritis can lead to stomach bleeding and ulcers. What are the causes? This condition may be caused by:  An infection.  Drinking too much alcohol.  Certain medicines. These include steroids, antibiotics, and some over-the-counter medicines, such as aspirin or ibuprofen.  Having too much acid in the stomach.  A disease of the intestines or stomach.  Stress.  An allergic reaction.  Crohn's disease.  Some cancer treatments (radiation). Sometimes the cause of this condition is not known. What are the signs or symptoms? Symptoms of this condition include:  Pain or a burning sensation in the upper abdomen.  Nausea.  Vomiting.  An uncomfortable feeling of fullness after eating.  Weight loss.  Bad breath.  Blood in your vomit or stools. In some cases, there are no symptoms. How is this diagnosed? This condition may be diagnosed with:  Your medical history and a description of your symptoms.  A physical exam.  Tests. These can include: ? Blood tests. ? Stool tests. ? A test in which a thin, flexible instrument with a light and a camera is passed down the esophagus and into the stomach (upper endoscopy). ? A test in which a sample of tissue is taken for testing (biopsy). How is this treated? This condition may be treated with medicines. The medicines that are used vary depending on the cause of the gastritis:  If the condition is caused by a bacterial infection, you may be given antibiotic medicines.  If the condition is caused by too much acid in the stomach, you may be given medicines called H2 blockers, proton pump inhibitors, or antacids. Treatment  may also involve stopping the use of certain medicines, such as aspirin, ibuprofen, or other NSAIDs. Follow these instructions at home: Medicines  Take over-the-counter and prescription medicines only as told by your health care provider.  If you were prescribed an antibiotic medicine, take it as told by your health care provider. Do not stop taking the antibiotic even if you start to feel better. Eating and drinking   Eat small, frequent meals instead of large meals.  Avoid foods and drinks that make your symptoms worse.  Drink enough fluid to keep your urine pale yellow. Alcohol use  Do not drink alcohol if: ? Your health care provider tells you not to drink. ? You are pregnant, may be pregnant, or are planning to become pregnant.  If you drink alcohol: ? Limit your use to:  0-1 drink a day for women.  0-2 drinks a day for men. ? Be aware of how much alcohol is in your drink. In the [place] one drink equals one 12 oz bottle of beer (355 mL), one 5 oz glass of wine (148 mL), or one 1 oz glass of hard liquor (44 mL). General instructions  Talk with your health care provider about ways to manage stress, such as getting regular exercise or practicing deep breathing, meditation, or yoga.  Do not use any products that contain nicotine or tobacco, such as cigarettes and e-cigarettes. If you need help quitting, ask your health care provider.  Keep all follow-up visits as told by your health care provider. This is important. Contact a health care provider if:  Your   symptoms get worse.  Your symptoms return after treatment. Get help right away if:  You vomit blood or material that looks like coffee grounds.  You have [place] or dark red stools.  You are unable to keep fluids down.  Your abdominal pain gets worse.  You have a fever.  You do not feel better after one week. Summary  Gastritis is inflammation of the lining of the stomach that can occur suddenly (acute) or  develop slowly over time (chronic).  This condition is diagnosed with a medical history, a physical exam, or tests.  This condition may be treated with medicines to treat infection or medicines to reduce the amount of acid in your stomach.  Follow your health care provider's instructions about taking medicines, making changes to your diet, and knowing when to call for help. This information is not intended to replace advice given to you by your health care provider. Make sure you discuss any questions you have with your health care provider. Document Revised: 04/06/2018 Document Reviewed: 04/06/2018 Elsevier Patient Education  2020 [employer] Hiatal Hernia  A hiatal hernia occurs when part of the stomach slides above the muscle that separates the abdomen from the chest (diaphragm). A person can be born with a hiatal hernia (congenital), or it may develop over time. In almost all cases of hiatal hernia, only the top part of the stomach pushes through the diaphragm. Many people have a hiatal hernia with no symptoms. The larger the hernia, the more likely it is that you will have symptoms. In some cases, a hiatal hernia allows stomach acid to flow back into the tube that carries food from your mouth to your stomach (esophagus). This may cause heartburn symptoms. Severe heartburn symptoms may mean that you have developed a condition called gastroesophageal reflux disease (GERD). What are the causes? This condition is caused by a weakness in the opening (hiatus) where the esophagus passes through the diaphragm to attach to the upper part of the stomach. A person may be born with a weakness in the hiatus, or a weakness can develop over time. What increases the risk? This condition is more likely to develop in:  Older people. Age is a major risk factor for a hiatal hernia, especially if you are over the age of 64.  Pregnant women.  People who are overweight.  People who have frequent  constipation. What are the signs or symptoms? Symptoms of this condition usually develop in the form of GERD symptoms. Symptoms include:  Heartburn.  Belching.  Indigestion.  Trouble swallowing.  Coughing or wheezing.  Sore throat.  Hoarseness.  Chest pain.  Nausea and vomiting. How is this diagnosed? This condition may be diagnosed during testing for GERD. Tests that may be done include:  X-rays of your stomach or chest.  An upper gastrointestinal (GI) series. This is an X-ray exam of your GI tract that is taken after you swallow a chalky liquid that shows up clearly on the X-ray.  Endoscopy. This is a procedure to look into your stomach using a thin, flexible tube that has a tiny camera and light on the end of it. How is this treated? This condition may be treated by:  Dietary and lifestyle changes to help reduce GERD symptoms.  Medicines. These may include: ? Over-the-counter antacids. ? Medicines that make your stomach empty more quickly. ? Medicines that block the production of stomach acid (H2 blockers). ? Stronger medicines to reduce stomach acid (proton pump inhibitors).  Surgery to  repair the hernia, if other treatments are not helping. If you have no symptoms, you may not need treatment. Follow these instructions at home: Lifestyle and activity  Do not use any products that contain nicotine or tobacco, such as cigarettes and e-cigarettes. If you need help quitting, ask your health care provider.  Try to achieve and maintain a healthy body weight.  Avoid putting pressure on your abdomen. Anything that puts pressure on your abdomen increases the amount of acid that may be pushed up into your esophagus. ? Avoid bending over, especially after eating. ? Raise the head of your bed by putting blocks under the legs. This keeps your head and esophagus higher than your stomach. ? Do not wear tight clothing around your chest or stomach. ? Try not to strain when  having a bowel movement, when urinating, or when lifting heavy objects. Eating and drinking  Avoid foods that can worsen GERD symptoms. These may include: ? Fatty foods, like fried foods. ? Citrus fruits, like oranges or lemon. ? Other foods and drinks that contain acid, like orange juice or tomatoes. ? Spicy food. ? Chocolate.  Eat frequent small meals instead of three large meals a day. This helps prevent your stomach from getting too full. ? Eat slowly. ? Do not lie down right after eating. ? Do not eat 1-2 hours before bed.  Do not drink beverages with caffeine. These include cola, coffee, cocoa, and tea.  Do not drink alcohol. General instructions  Take over-the-counter and prescription medicines only as told by your health care provider.  Keep all follow-up visits as told by your health care provider. This is important. Contact a health care provider if:  Your symptoms are not controlled with medicines or lifestyle changes.  You are having trouble swallowing.  You have coughing or wheezing that will not go away. Get help right away if:  Your pain is getting worse.  Your pain spreads to your arms, neck, jaw, teeth, or back.  You have shortness of breath.  You sweat for no reason.  You feel sick to your stomach (nauseous) or you vomit.  You vomit blood.  You have bright red blood in your stools.  You have Straw[place], tarry stools. This information is not intended to replace advice given to you by your health care provider. Make sure you discuss any questions you have with your health care provider. Document Revised: 10/30/2017 Document Reviewed: 06/22/2017 Elsevier Patient Education  2020 [employer].

## 2020-06-08 ENCOUNTER — Encounter: Payer: Self-pay | Admitting: Family Medicine

## 2020-06-08 LAB — CBC WITH DIFFERENTIAL/PLATELET
Basophils Absolute: 0.1 10*3/uL (ref 0.0–0.2)
Basos: 1 %
EOS (ABSOLUTE): 0.1 10*3/uL (ref 0.0–0.4)
Eos: 1 %
Hematocrit: 44.7 % (ref 37.5–51.0)
Hemoglobin: 15.4 g/dL (ref 13.0–17.7)
Immature Grans (Abs): 0 10*3/uL (ref 0.0–0.1)
Immature Granulocytes: 0 %
Lymphocytes Absolute: 3.6 10*3/uL — ABNORMAL HIGH (ref 0.7–3.1)
Lymphs: 36 %
MCH: 29.5 pg (ref 26.6–33.0)
MCHC: 34.5 g/dL (ref 31.5–35.7)
MCV: 86 fL (ref 79–97)
Monocytes Absolute: 0.7 10*3/uL (ref 0.1–0.9)
Monocytes: 7 %
Neutrophils Absolute: 5.6 10*3/uL (ref 1.4–7.0)
Neutrophils: 55 %
Platelets: 423 10*3/uL (ref 150–450)
RBC: 5.22 x10E6/uL (ref 4.14–5.80)
RDW: 13.5 % (ref 11.6–15.4)
WBC: 10.1 10*3/uL (ref 3.4–10.8)

## 2020-06-08 LAB — COMPREHENSIVE METABOLIC PANEL
ALT: 31 IU/L (ref 0–44)
AST: 20 IU/L (ref 0–40)
Albumin/Globulin Ratio: 1.6 (ref 1.2–2.2)
Albumin: 5.1 g/dL — ABNORMAL HIGH (ref 4.0–5.0)
Alkaline Phosphatase: 103 IU/L (ref 48–121)
BUN/Creatinine Ratio: 13 (ref 9–20)
BUN: 13 mg/dL (ref 6–20)
Bilirubin Total: 0.3 mg/dL (ref 0.0–1.2)
CO2: 23 mmol/L (ref 20–29)
Calcium: 10.2 mg/dL (ref 8.7–10.2)
Chloride: 102 mmol/L (ref 96–106)
Creatinine, Ser: 0.99 mg/dL (ref 0.76–1.27)
GFR calc Af Amer: 116 mL/min/{1.73_m2} (ref >59)
GFR calc non Af Amer: 100 mL/min/{1.73_m2} (ref >59)
Globulin, Total: 3.1 g/dL (ref 1.5–4.5)
Glucose: 84 mg/dL (ref 65–99)
Potassium: 4.1 mmol/L (ref 3.5–5.2)
Sodium: 142 mmol/L (ref 134–144)
Total Protein: 8.2 g/dL (ref 6.0–8.5)

## 2020-06-08 LAB — LIPASE: Lipase: 14 U/L (ref 13–78)

## 2020-06-08 LAB — AMYLASE: Amylase: 45 U/L (ref 31–110)

## 2020-06-09 LAB — H. PYLORI BREATH TEST: H pylori Breath Test: NEGATIVE

## 2020-06-12 ENCOUNTER — Telehealth: Payer: Self-pay

## 2020-06-12 NOTE — Telephone Encounter (Signed)
-----   Message from Tamsen Roers, [place] sent at 06/11/2020  7:13 PM EDT ----- H. Pylori test is negative and remainder of blood tests essentially normal. If continuing to have abdominal discomfort, will need to schedule gastroenterology referral.

## 2020-06-12 NOTE — Telephone Encounter (Signed)
Called to advise patient as below and patient also viewed Maurine Minister' comments regarding his labs through [place].

## 2020-06-28 ENCOUNTER — Ambulatory Visit: Payer: 59 | Admitting: Family Medicine

## 2020-08-06 ENCOUNTER — Emergency Department
Admission: EM | Admit: 2020-08-06 | Discharge: 2020-08-06 | Disposition: A | Discharge status: Home / Self Care | Payer: 59 | Source: Home / Self Care | Attending: Emergency Medicine | Admitting: Emergency Medicine

## 2020-08-06 ENCOUNTER — Emergency Department: Payer: 59

## 2020-08-06 ENCOUNTER — Other Ambulatory Visit (HOSPITAL_COMMUNITY): Payer: Self-pay | Admitting: Adult Health

## 2020-08-06 ENCOUNTER — Ambulatory Visit (HOSPITAL_COMMUNITY)
Admission: RE | Admit: 2020-08-06 | Discharge: 2020-08-06 | Disposition: A | Discharge status: Home / Self Care | Payer: 59 | Source: Ambulatory Visit | Attending: Pulmonary Disease | Admitting: Pulmonary Disease

## 2020-08-06 ENCOUNTER — Other Ambulatory Visit: Payer: Self-pay

## 2020-08-06 ENCOUNTER — Ambulatory Visit
Admission: EM | Admit: 2020-08-06 | Discharge: 2020-08-06 | Disposition: A | Discharge status: Home / Self Care | Payer: 59 | Attending: Internal Medicine | Admitting: Internal Medicine

## 2020-08-06 DIAGNOSIS — U071 COVID-19: Secondary | ICD-10-CM

## 2020-08-06 LAB — COMPREHENSIVE METABOLIC PANEL
ALT: 28 U/L (ref 0–44)
AST: 31 U/L (ref 15–41)
Albumin: 4.3 g/dL (ref 3.5–5.0)
Alkaline Phosphatase: 60 U/L (ref 38–126)
Anion gap: 11 (ref 5–15)
BUN: 11 mg/dL (ref 6–20)
CO2: 24 mmol/L (ref 22–32)
Calcium: 8.4 mg/dL — ABNORMAL LOW (ref 8.9–10.3)
Chloride: 101 mmol/L (ref 98–111)
Creatinine, Ser: 0.93 mg/dL (ref 0.61–1.24)
GFR calc Af Amer: >60 mL/min (ref >60)
GFR calc non Af Amer: >60 mL/min (ref >60)
Glucose, Bld: 112 mg/dL — ABNORMAL HIGH (ref 70–99)
Potassium: 3.4 mmol/L — ABNORMAL LOW (ref 3.5–5.1)
Sodium: 136 mmol/L (ref 135–145)
Total Bilirubin: 0.6 mg/dL (ref 0.3–1.2)
Total Protein: 7.7 g/dL (ref 6.5–8.1)

## 2020-08-06 LAB — CBC
HCT: 41.3 % (ref 39.0–52.0)
Hemoglobin: 14.6 g/dL (ref 13.0–17.0)
MCH: 29 pg (ref 26.0–34.0)
MCHC: 35.4 g/dL (ref 30.0–36.0)
MCV: 82.1 fL (ref 80.0–100.0)
Platelets: 240 10*3/uL (ref 150–400)
RBC: 5.03 MIL/uL (ref 4.22–5.81)
RDW: 13.4 % (ref 11.5–15.5)
WBC: 7.4 10*3/uL (ref 4.0–10.5)
nRBC: 0 % (ref 0.0–0.2)

## 2020-08-06 LAB — SARS CORONAVIRUS 2 BY RT PCR (HOSPITAL ORDER, PERFORMED IN ~~LOC~~ HOSPITAL LAB): SARS Coronavirus 2: POSITIVE — AB

## 2020-08-06 MED ORDER — ALBUTEROL SULFATE HFA 108 (90 BASE) MCG/ACT IN AERS: 2.0000 | INHALATION_SPRAY | Freq: Once | RESPIRATORY_TRACT | Status: DC | PRN

## 2020-08-06 MED ORDER — KETOROLAC TROMETHAMINE 60 MG/2ML IM SOLN: 60.0000 mg | Freq: Once | INTRAMUSCULAR | Status: DC

## 2020-08-06 MED ORDER — ACETAMINOPHEN 500 MG PO TABS
1000.0000 mg | ORAL_TABLET | Freq: Once | ORAL | Status: AC
  Administered 2020-08-06: 1000 mg via ORAL

## 2020-08-06 MED ORDER — SODIUM CHLORIDE 0.9 % IV SOLN: INTRAVENOUS | Status: DC | PRN

## 2020-08-06 MED ORDER — ONDANSETRON 4 MG PO TBDP: 4.0000 mg | ORAL_TABLET | Freq: Once | ORAL | Status: DC

## 2020-08-06 MED ORDER — SODIUM CHLORIDE 0.9 % IV SOLN
1200.0000 mg | Freq: Once | INTRAVENOUS | Status: AC
  Administered 2020-08-06: 1200 mg via INTRAVENOUS
  Filled 2020-08-06: qty 10

## 2020-08-06 MED ORDER — KETOROLAC TROMETHAMINE 30 MG/ML IJ SOLN
30.0000 mg | Freq: Once | INTRAMUSCULAR | Status: AC
  Administered 2020-08-06: 30 mg via INTRAVENOUS
  Filled 2020-08-06: qty 1

## 2020-08-06 MED ORDER — FAMOTIDINE IN NACL 20-0.9 MG/50ML-% IV SOLN: 20.0000 mg | Freq: Once | INTRAVENOUS | Status: DC | PRN

## 2020-08-06 MED ORDER — GUAIFENESIN-CODEINE 100-10 MG/5ML PO SOLN: 5.0000 mL | Freq: Four times a day (QID) | ORAL | 0 refills | Status: DC | PRN

## 2020-08-06 MED ORDER — METHYLPREDNISOLONE SODIUM SUCC 125 MG IJ SOLR: 125.0000 mg | Freq: Once | INTRAMUSCULAR | Status: DC | PRN

## 2020-08-06 MED ORDER — SODIUM CHLORIDE 0.9 % IV BOLUS
1000.0000 mL | Freq: Once | INTRAVENOUS | Status: AC
  Administered 2020-08-06: 1000 mL via INTRAVENOUS

## 2020-08-06 MED ORDER — EPINEPHRINE 0.3 MG/0.3ML IJ SOAJ: 0.3000 mg | Freq: Once | INTRAMUSCULAR | Status: DC | PRN

## 2020-08-06 MED ORDER — DIPHENHYDRAMINE HCL 50 MG/ML IJ SOLN: 50.0000 mg | Freq: Once | INTRAMUSCULAR | Status: DC | PRN

## 2020-08-06 MED ORDER — ONDANSETRON HCL 4 MG/2ML IJ SOLN
4.0000 mg | Freq: Once | INTRAMUSCULAR | Status: AC
  Administered 2020-08-06: 4 mg via INTRAVENOUS
  Filled 2020-08-06: qty 2

## 2020-08-06 MED ORDER — ACETAMINOPHEN 500 MG PO TABS
1000.0000 mg | ORAL_TABLET | Freq: Once | ORAL | Status: AC
  Administered 2020-08-06: 1000 mg via ORAL
  Filled 2020-08-06: qty 2

## 2020-08-06 MED ORDER — PREDNISONE 10 MG PO TABS: 10.0000 mg | ORAL_TABLET | Freq: Every day | ORAL | 0 refills | Status: DC

## 2020-08-06 MED ORDER — ONDANSETRON HCL 4 MG PO TABS: 4.0000 mg | ORAL_TABLET | Freq: Every day | ORAL | 0 refills | Status: DC | PRN

## 2020-08-06 MED ORDER — DEXAMETHASONE SODIUM PHOSPHATE 10 MG/ML IJ SOLN
10.0000 mg | Freq: Once | INTRAMUSCULAR | Status: AC
  Administered 2020-08-06: 10 mg via INTRAVENOUS
  Filled 2020-08-06: qty 1

## 2020-08-06 NOTE — Progress Notes (Signed)
  Diagnosis: COVID-19  Physician: Dr.  Patrick Wright  Procedure: Cov[facility] Infusion Clinic Med: casirivimab\imdevimab infusion - Provided patient with casirivimab\imdevimab fact sheet for patients, parents and caregivers prior to infusion.  Complications: No immediate complications noted.  Discharge: Discharged home   Ally Yow 08/06/2020   

## 2020-08-06 NOTE — Discharge Instructions (Signed)

## 2020-08-06 NOTE — ED Notes (Signed)
Patient verbalizes understanding of discharge instructions. Opportunity for questioning and answers were provided. Armband removed by staff, pt discharged from ED. Ambulated out to lobby  

## 2020-08-06 NOTE — Discharge Instructions (Addendum)
Hello Derek Wheeler.,   You have been scheduled to receive Regeneron (the monoclonal antibody we discussed) on : 08/06/2020 as soon as you leave the ER  If you have been tested outside of a  Facility - you MUST bring a copy of your positive test with you the morning of your appointment. You may take a photo of this and upload to your MyChart portal or have the testing facility fax the result to [phone]    The address for the infusion clinic site is:  --GPS address is [address] - the parking is located near [employer] building where you will see  COVID19 Infusion feather banner marking the entrance to parking.   (see photos below)            --Enter into the 2nd entrance where the "wave, flag banner" is at the road. Turn into this 2nd entrance and immediately turn left to [place] in 1 of the 5 parking spots.   --Please stay in your car and call the desk for assistance inside [phone].   --Average time in department is roughly 2 hours for Regeneron treatment - this includes preparation of the medication, IV start and the required 1 hour monitoring after the infusion.    Should you develop worsening shortness of breath, chest pain or severe breathing problems please do not wait for this appointment and go to the Emergency room for evaluation and treatment. You will undergo another oxygen screen before your infusion to ensure this is the best treatment option for you. There is a chance that the best decision may be to send you to the Emergency Room for evaluation at the time of your appointment.   The day of your visit you should:  Get plenty of rest the night before and drink plenty of water  Eat a light meal/snack before coming and take your medications as prescribed   Wear warm, comfortable clothes with a shirt that can roll-up over the elbow (will need IV start).   Wear a mask   Consider bringing some activity to help pass the time  Many commercial  insurers are waiving bills related to COVID treatment however some have ranged from $300-640. We are starting to see some insurers send bills to patients later for the administration of the medication - we are learning more information but you may receive a bill after your appointment.  Please contact your insurance agent to discuss prior to your appointment if you would like further details about billing specific to your policy.    I hope this helps find you feeling better,  Noreene Filbert

## 2020-08-06 NOTE — ED Notes (Addendum)
Pt to go to Covid infusion clinic after d/c - # [phone]

## 2020-08-06 NOTE — Discharge Instructions (Signed)
You need higher level of evaluation in the Emergency department today. Please report there following discharge

## 2020-08-06 NOTE — ED Triage Notes (Signed)
Pt reports his symptoms started a week ago. Had positive COVID test on Thursday. States he has cough, body aches and fever. Here because he can't keep his fever down.

## 2020-08-06 NOTE — Progress Notes (Signed)
I connected by phone with Derek Wheeler. on 08/06/2020 at 3:56 PM to discuss the potential use of a new treatment for mild to moderate COVID-19 viral infection in non-hospitalized patients.  This patient is a 34 y.o. male that meets the FDA criteria for Emergency Use Authorization of COVID monoclonal antibody casirivimab/imdevimab.  Has a (+) direct SARS-CoV-2 viral test result  Has mild or moderate COVID-19   Is NOT hospitalized due to COVID-19  Is within 10 days of symptom onset  Has at least one of the high risk factor(s) for progression to severe COVID-19 and/or hospitalization as defined in EUA.  Specific high risk criteria : BMI > 25   I have spoken and communicated the following to the patient or parent/caregiver regarding COVID monoclonal antibody treatment:  1. FDA has authorized the emergency use for the treatment of mild to moderate COVID-19 in adults and pediatric patients with positive results of direct SARS-CoV-2 viral testing who are 79 years of age and older weighing at least 40 kg, and who are at high risk for progressing to severe COVID-19 and/or hospitalization.  2. The significant known and potential risks and benefits of COVID monoclonal antibody, and the extent to which such potential risks and benefits are unknown.  3. Information on available alternative treatments and the risks and benefits of those alternatives, including clinical trials.  4. Patients treated with COVID monoclonal antibody should continue to self-isolate and use infection control measures (e.g., wear mask, isolate, social distance, avoid sharing personal items, clean and disinfect "high touch" surfaces, and frequent handwashing) according to CDC guidelines.   5. The patient or parent/caregiver has the option to accept or refuse COVID monoclonal antibody treatment.  After reviewing this information with the patient, The patient agreed to proceed with receiving casirivimab\imdevimab infusion and  will be provided a copy of the Fact sheet prior to receiving the infusion. Noreene Filbert 08/06/2020 3:56 PM

## 2020-08-06 NOTE — ED Triage Notes (Signed)
Pt states he has had cough with congestion and fever 102-103 for a week today, states he was dx with covid on Thursday and states when he went there today and told his oxygen was 90%on RA, currently 94%RA. Pt is in NAD. Took tylenol last around 10am

## 2020-08-06 NOTE — ED Provider Notes (Signed)
[facility] Emergency Department Provider Note  Time seen: 12:35 PM  I have reviewed the triage vital signs and the nursing notes.   HISTORY  Chief Complaint URI  HPI Derek Wheeler. is a 34 y.o. male with no significant past medical history presents to the emergency department for shortness of breath and cough.  According to the patient for the past for 5 days he has had cough shortness of breath and fever.  Patient states he has had fever as high as 102 at home has not been able to get the fever down.  Patient went to urgent care today and had a room air saturation around 90% was sent to the emergency department for further work-up and evaluation.  Patient has not been vaccinated against Covid.  States nausea but denies vomiting.  Denies chest or abdominal pain.   Past Medical History:  Diagnosis Date  . Hx of dysplastic nevus 09/10/2018   R upper back, moderate atypia    Patient Active Problem List   Diagnosis Date Noted  . Acute appendicitis   . Hx of dysplastic nevus 09/10/2018  . History of Salmonella infection 03/08/2018  . Second hand smoke exposure 03/08/2018  . Class 2 obesity with body mass index (BMI) of 36.0 to 36.9 in adult 11/03/2017  . Hyperlipidemia 11/03/2017  . Right foot pain 11/03/2017    Past Surgical History:  Procedure Laterality Date  . LAPAROSCOPIC APPENDECTOMY N/A 09/23/2018   Procedure: APPENDECTOMY LAPAROSCOPIC;  Surgeon: Leafy Ro, MD;  Location: [facility] ORS;  Service: General;  Laterality: N/A;  . NO PAST SURGERIES      Prior to Admission medications   Medication Sig Start Date End Date Taking? Authorizing Provider  clindamycin-benzoyl peroxide (BENZACLIN) gel Apply topically 2 (two) times daily. 08/25/18   Margaretann Loveless, PA-C  clobetasol (TEMOVATE) 0.05 % external solution Apply 1 application topically 2 (two) times daily. 08/25/18   Margaretann Loveless, PA-C  hydrocortisone 2.5 % lotion Apply 1 application  topically 2 (two) times daily. 09/10/18   [provider]  loratadine (CLARITIN) 10 MG tablet Take 10 mg by mouth daily.    [provider]  magnesium oxide (MAG-OX) 400 MG tablet Take 400 mg by mouth daily.    [provider]  Omega-3 Fatty Acids (FISH OIL) 1000 MG CAPS Take 1,000 mg by mouth daily.     [provider]  omeprazole (PRILOSEC) 20 MG capsule Take 1 capsule (20 mg total) by mouth daily. 06/07/20   Chrismon, Jodell Cipro, PA  ORACEA 40 MG capsule Take 40 mg by mouth daily. 09/13/18   [provider]  phentermine (ADIPEX-P) 37.5 MG tablet Take 1 tablet (37.5 mg total) by mouth daily before breakfast. 03/12/18   Burnette, Alessandra Bevels, PA-C  SOOLANTRA 1 % CREA Apply 1 application topically daily. 09/13/18   [provider]  tretinoin (RETIN-A) 0.025 % cream Apply topically at bedtime. 04/08/18   Margaretann Loveless, PA-C  triamcinolone lotion (KENALOG) 0.1 % Apply 1 application topically as directed. 09/13/18   [provider]    No Known Allergies  Family History  Problem Relation Age of Onset  . Hypertension Mother   . Hyperlipidemia Mother   . Arthritis Father   . Hyperlipidemia Father   . Hypertension Maternal Grandfather   . Skin cancer Maternal Grandfather     Social History Social History   Tobacco Use  . Smoking status: Never Smoker  . Smokeless tobacco: Never  Used  Vaping Use  . Vaping Use: Never used  Substance Use Topics  . Alcohol use: Yes    Alcohol/week: 2.0 standard drinks    Types: 2 Cans of beer per week    Comment: on weekends  . Drug use: No    Review of Systems Constitutional: Positive for fever x5 days ENT: Negative for recent illness/congestion Cardiovascular: Negative for chest pain. Respiratory: Positive for shortness of breath and cough x5 days Gastrointestinal: Negative for abdominal pain, vomiting.  Positive for nausea. Musculoskeletal: Negative for musculoskeletal  complaints Neurological: Negative for headache All other ROS negative  ____________________________________________   PHYSICAL EXAM:  VITAL SIGNS: ED Triage Vitals  Enc Vitals Group     BP 08/06/20 1028 121/81     Pulse Rate 08/06/20 1028 (!) 122     Resp 08/06/20 1028 20     Temp 08/06/20 1028 (!) 100.7 F (38.2 C)     Temp Source 08/06/20 1028 Oral     SpO2 08/06/20 1028 94 %     Weight 08/06/20 1030 260 lb (117.9 kg)     Height 08/06/20 1030 5\' 10"  (1.778 m)     Head Circumference --      Peak Flow --      Pain Score 08/06/20 1029 6     Pain Loc --      Pain Edu? --      Excl. in GC? --    Constitutional: Alert and oriented. Well appearing and in no distress. Eyes: Normal exam ENT      Head: Normocephalic and atraumatic.      Mouth/Throat: Mucous membranes are moist. Cardiovascular: Regular rhythm around 120 bpm.  No murmur. Respiratory: Normal respiratory effort without tachypnea nor retractions. Breath sounds are clear without obvious wheeze rales or rhonchi.  Frequent cough. Gastrointestinal: Soft and nontender. No distention.  Musculoskeletal: Nontender with normal range of motion in all extremities. Neurologic:  Normal speech and language. No gross focal neurologic deficits Skin:  Skin is warm, dry and intact.  Psychiatric: Mood and affect are normal.   ____________________________________________   RADIOLOGY  Chest x-ray shows bilateral multifocal pneumonia.  ____________________________________________   INITIAL IMPRESSION / ASSESSMENT AND PLAN / ED COURSE  Pertinent labs & imaging results that were available during my care of the patient were reviewed by me and considered in my medical decision making (see chart for details).   Patient presents emergency department for 5 days of fever cough congestion shortness of breath.  Patient symptoms are very suggestive of upper respiratory infection/pneumonia.  Patient is febrile and tachycardic in the  emergency department reassuringly around 94% on room air.  We will continue to closely monitor pulse oximetry in the emergency department.  We will test for coronavirus.  Chest x-ray suggests viral pneumonia.  We will check labs, treat with IV fluids nausea medication IV steroids and Toradol.  Patient is feeling better after medications.  Patient has been able to maintain O2 saturations in the low to mid 90s on room air.  We will discharge patient home on prednisone, Zofran and cough medication.  I discussed return precautions.  Patient agreeable to plan of care.  I have sent a referral for consideration of monoclonal antibody treatment.  10/06/20. was evaluated in Emergency Department on 08/06/2020 for the symptoms described in the history of present illness. He was evaluated in the context of the global COVID-19 pandemic, which necessitated consideration that the patient might be at risk for infection  with the SARS-CoV-2 virus that causes COVID-19. Institutional protocols and algorithms that pertain to the evaluation of patients at risk for COVID-19 are in a state of rapid change based on information released by regulatory bodies including the CDC and federal and state organizations. These policies and algorithms were followed during the patient's care in the ED.  ____________________________________________   FINAL CLINICAL IMPRESSION(S) / ED DIAGNOSES  COVID-19   Minna Antis, MD 08/06/20 [phone]

## 2020-08-06 NOTE — ED Provider Notes (Signed)
[facility]    CSN: [id number] Arrival date & time: 08/06/20  1517      History   Chief Complaint Chief Complaint  Patient presents with  . Covid Positive    HPI Derek Wheeler. is a 34 y.o. male.   Patient with Covid positive at outside facility presents for fever, shortness of breath, worsening cough.  Reports loose stools as well.  Reports he is Covid positive late last week.  Since then has had worsening cough and some lightheaded feeling shortness of breath.  Denies chest pain.  Denies vomiting.  Has been taking Tylenol for fever and this will come down and then return.  Denies nausea and vomiting.  Has had minimal desire to eat.  Drinking some water.     Past Medical History:  Diagnosis Date  . Hx of dysplastic nevus 09/10/2018   R upper back, moderate atypia    Patient Active Problem List   Diagnosis Date Noted  . Acute appendicitis   . Hx of dysplastic nevus 09/10/2018  . History of Salmonella infection 03/08/2018  . Second hand smoke exposure 03/08/2018  . Class 2 obesity with body mass index (BMI) of 36.0 to 36.9 in adult 11/03/2017  . Hyperlipidemia 11/03/2017  . Right foot pain 11/03/2017    Past Surgical History:  Procedure Laterality Date  . LAPAROSCOPIC APPENDECTOMY N/A 09/23/2018   Procedure: APPENDECTOMY LAPAROSCOPIC;  Surgeon: Leafy Ro, MD;  Location: [facility] ORS;  Service: General;  Laterality: N/A;  . NO PAST SURGERIES         Home Medications    Prior to Admission medications   Medication Sig Start Date End Date Taking? Authorizing Provider  clindamycin-benzoyl peroxide (BENZACLIN) gel Apply topically 2 (two) times daily. 08/25/18   Margaretann Loveless, PA-C  clobetasol (TEMOVATE) 0.05 % external solution Apply 1 application topically 2 (two) times daily. 08/25/18   Margaretann Loveless, PA-C  hydrocortisone 2.5 % lotion Apply 1 application topically 2 (two) times daily. 09/10/18   [provider]  loratadine  (CLARITIN) 10 MG tablet Take 10 mg by mouth daily.    [provider]  magnesium oxide (MAG-OX) 400 MG tablet Take 400 mg by mouth daily.    [provider]  Omega-3 Fatty Acids (FISH OIL) 1000 MG CAPS Take 1,000 mg by mouth daily.     [provider]  omeprazole (PRILOSEC) 20 MG capsule Take 1 capsule (20 mg total) by mouth daily. 06/07/20   Chrismon, Jodell Cipro, PA  ORACEA 40 MG capsule Take 40 mg by mouth daily. 09/13/18   [provider]  phentermine (ADIPEX-P) 37.5 MG tablet Take 1 tablet (37.5 mg total) by mouth daily before breakfast. 03/12/18   Burnette, Alessandra Bevels, PA-C  SOOLANTRA 1 % CREA Apply 1 application topically daily. 09/13/18   [provider]  tretinoin (RETIN-A) 0.025 % cream Apply topically at bedtime. 04/08/18   Margaretann Loveless, PA-C  triamcinolone lotion (KENALOG) 0.1 % Apply 1 application topically as directed. 09/13/18   [provider]    Family History Family History  Problem Relation Age of Onset  . Hypertension Mother   . Hyperlipidemia Mother   . Arthritis Father   . Hyperlipidemia Father   . Hypertension Maternal Grandfather   . Skin cancer Maternal Grandfather     Social History Social History   Tobacco Use  . Smoking status: Never Smoker  . Smokeless tobacco: Never Used  Vaping Use  . Vaping Use:  Never used  Substance Use Topics  . Alcohol use: Yes    Alcohol/week: 2.0 standard drinks    Types: 2 Cans of beer per week    Comment: on weekends  . Drug use: No     Allergies   Patient has no known allergies.   Review of Systems Review of Systems   Physical Exam Triage Vital Signs ED Triage Vitals  Enc Vitals Group     BP 08/06/20 0847 121/79     Pulse Rate 08/06/20 0847 (!) 122     Resp 08/06/20 0847 (!) 22     Temp 08/06/20 0847 (!) 101.9 F (38.8 C)     Temp Source 08/06/20 0847 Oral     SpO2 08/06/20 0847 91 %     Weight 08/06/20 0846 260 lb (117.9 kg)     Height 08/06/20  0846 5\' 10"  (1.778 m)     Head Circumference --      Peak Flow --      Pain Score 08/06/20 0846 6     Pain Loc --      Pain Edu? --      Excl. in GC? --    No data found.  Updated Vital Signs BP 121/79 (BP Location: Left Arm)   Pulse (!) 130   Temp (!) 101.9 F (38.8 C) (Oral)   Resp (!) 22   Ht 5\' 10"  (1.778 m)   Wt 260 lb (117.9 kg)   SpO2 93% Comment: walking O2 sat  BMI 37.31 kg/m   Visual Acuity Right Eye Distance:   Left Eye Distance:   Bilateral Distance:    Right Eye Near:   Left Eye Near:    Bilateral Near:     Physical Exam Vitals and nursing note reviewed.  Constitutional:      Appearance: He is well-developed. He is ill-appearing. He is not diaphoretic.  HENT:     Head: Normocephalic and atraumatic.     Right Ear: Tympanic membrane normal.     Left Ear: Tympanic membrane normal.     Nose: Congestion and rhinorrhea present.     Mouth/Throat:     Mouth: Mucous membranes are moist.  Eyes:     Extraocular Movements: Extraocular movements intact.     Conjunctiva/sclera: Conjunctivae normal.     Pupils: Pupils are equal, round, and reactive to light.  Cardiovascular:     Rate and Rhythm: Regular rhythm. Tachycardia present.     Heart sounds: No murmur heard.   Pulmonary:     Effort: Pulmonary effort is normal. No respiratory distress.     Breath sounds: Normal breath sounds.     Comments: Some coarse sounds throughout.  Not in respiratory distress.  Saturating between 91 and 93% on room air. Abdominal:     Palpations: Abdomen is soft.     Tenderness: There is no abdominal tenderness.  Musculoskeletal:     Cervical back: Neck supple.     Right lower leg: No edema.     Left lower leg: No edema.  Skin:    General: Skin is warm and dry.  Neurological:     General: No focal deficit present.     Mental Status: He is alert and oriented to person, place, and time.      UC Treatments / Results  Labs (all labs ordered are listed, but only abnormal  results are displayed) Labs Reviewed - No data to display  EKG     Procedures Procedures (including critical  care time)  Medications Ordered in UC Medications  acetaminophen (TYLENOL) tablet 1,000 mg (1,000 mg Oral Given 08/06/20 0853)    Initial Impression / Assessment and Plan / UC Course  I have reviewed the triage vital signs and the nursing notes.  Pertinent labs & imaging results that were available during my care of the patient were reviewed by me and considered in my medical decision making (see chart for details).     #Covid Patient is a 34 year old Covid positive from outside facility presenting with worsening symptoms.  Febrile, tachycardic to 130s, tachypneic and saturating in the low 90s.  Given present symptoms and Covid patient will need higher level of evaluation and work-up in the emergency department.  Patient was brought here by his girlfriend, believe he can self transport to nearest emergency department.  Did offer EMS, however patient would prefer to be transported by his girlfriend/fianc.  Tylenol given in clinic.  Patient discharged with instructions to be further evaluated in the emergency department.  He verbalized understanding agreement plan of care. Final Clinical Impressions(s) / UC Diagnoses   Final diagnoses:  COVID-19     Discharge Instructions     You need higher level of evaluation in the Emergency department today. Please report there following discharge     ED Prescriptions    None     PDMP not reviewed this encounter.   Hermelinda Medicus, PA-C 08/06/20 1124

## 2020-08-07 ENCOUNTER — Other Ambulatory Visit: Payer: Self-pay

## 2020-08-07 ENCOUNTER — Emergency Department: Payer: 59

## 2020-08-07 ENCOUNTER — Encounter: Payer: Self-pay | Admitting: *Deleted

## 2020-08-07 DIAGNOSIS — U071 COVID-19: Principal | ICD-10-CM | POA: Diagnosis present

## 2020-08-07 DIAGNOSIS — E785 Hyperlipidemia, unspecified: Secondary | ICD-10-CM | POA: Diagnosis present

## 2020-08-07 DIAGNOSIS — K219 Gastro-esophageal reflux disease without esophagitis: Secondary | ICD-10-CM | POA: Diagnosis present

## 2020-08-07 DIAGNOSIS — Z79899 Other long term (current) drug therapy: Secondary | ICD-10-CM

## 2020-08-07 DIAGNOSIS — J1282 Pneumonia due to coronavirus disease 2019: Secondary | ICD-10-CM | POA: Diagnosis present

## 2020-08-07 DIAGNOSIS — Z6836 Body mass index (BMI) 36.0-36.9, adult: Secondary | ICD-10-CM

## 2020-08-07 DIAGNOSIS — E668 Other obesity: Secondary | ICD-10-CM | POA: Diagnosis present

## 2020-08-07 DIAGNOSIS — Z83438 Family history of other disorder of lipoprotein metabolism and other lipidemia: Secondary | ICD-10-CM

## 2020-08-07 DIAGNOSIS — J9601 Acute respiratory failure with hypoxia: Secondary | ICD-10-CM | POA: Diagnosis present

## 2020-08-07 LAB — CBC
HCT: 42.5 % (ref 39.0–52.0)
Hemoglobin: 14.7 g/dL (ref 13.0–17.0)
MCH: 29 pg (ref 26.0–34.0)
MCHC: 34.6 g/dL (ref 30.0–36.0)
MCV: 83.8 fL (ref 80.0–100.0)
Platelets: 340 10*3/uL (ref 150–400)
RBC: 5.07 MIL/uL (ref 4.22–5.81)
RDW: 13.5 % (ref 11.5–15.5)
WBC: 13.3 10*3/uL — ABNORMAL HIGH (ref 4.0–10.5)
nRBC: 0 % (ref 0.0–0.2)

## 2020-08-07 LAB — BASIC METABOLIC PANEL
Anion gap: 9 (ref 5–15)
BUN: 13 mg/dL (ref 6–20)
CO2: 24 mmol/L (ref 22–32)
Calcium: 8.8 mg/dL — ABNORMAL LOW (ref 8.9–10.3)
Chloride: 106 mmol/L (ref 98–111)
Creatinine, Ser: 0.85 mg/dL (ref 0.61–1.24)
GFR calc Af Amer: >60 mL/min (ref >60)
GFR calc non Af Amer: >60 mL/min (ref >60)
Glucose, Bld: 128 mg/dL — ABNORMAL HIGH (ref 70–99)
Potassium: 3.5 mmol/L (ref 3.5–5.1)
Sodium: 139 mmol/L (ref 135–145)

## 2020-08-07 LAB — TROPONIN I (HIGH SENSITIVITY): Troponin I (High Sensitivity): 9 ng/L (ref <18)

## 2020-08-07 NOTE — ED Triage Notes (Signed)
FIRST NURSE NOTE: pt pulled from line, shortness of breath, pt is covid + sats 87% on room, difficulty speaking in short sentences without running out of breath.

## 2020-08-07 NOTE — ED Triage Notes (Addendum)
Pt to triage via wheelchair.  Pt has a cough and is sob. Marland Kitchen  Pt reports he is covid positive.  Pt was seen in ER yesterday.  Pt is on 4 liters oxygen.  Non smoker.  No chest pain.  Pt alert.

## 2020-08-08 ENCOUNTER — Inpatient Hospital Stay
Admission: EM | Admit: 2020-08-08 | Discharge: 2020-08-12 | DRG: 177 | Disposition: A | Discharge status: Home / Self Care | Payer: 59 | Attending: Internal Medicine | Admitting: Internal Medicine

## 2020-08-08 DIAGNOSIS — J1282 Pneumonia due to coronavirus disease 2019: Secondary | ICD-10-CM

## 2020-08-08 DIAGNOSIS — E669 Obesity, unspecified: Secondary | ICD-10-CM

## 2020-08-08 DIAGNOSIS — Z6836 Body mass index (BMI) 36.0-36.9, adult: Secondary | ICD-10-CM

## 2020-08-08 DIAGNOSIS — R0602 Shortness of breath: Secondary | ICD-10-CM | POA: Diagnosis present

## 2020-08-08 DIAGNOSIS — Z83438 Family history of other disorder of lipoprotein metabolism and other lipidemia: Secondary | ICD-10-CM | POA: Diagnosis not present

## 2020-08-08 DIAGNOSIS — U071 COVID-19: Secondary | ICD-10-CM | POA: Diagnosis present

## 2020-08-08 DIAGNOSIS — E785 Hyperlipidemia, unspecified: Secondary | ICD-10-CM | POA: Diagnosis present

## 2020-08-08 DIAGNOSIS — K219 Gastro-esophageal reflux disease without esophagitis: Secondary | ICD-10-CM | POA: Diagnosis present

## 2020-08-08 DIAGNOSIS — J9601 Acute respiratory failure with hypoxia: Secondary | ICD-10-CM | POA: Diagnosis present

## 2020-08-08 DIAGNOSIS — Z79899 Other long term (current) drug therapy: Secondary | ICD-10-CM | POA: Diagnosis not present

## 2020-08-08 DIAGNOSIS — E668 Other obesity: Secondary | ICD-10-CM | POA: Diagnosis present

## 2020-08-08 LAB — FERRITIN: Ferritin: 636 ng/mL — ABNORMAL HIGH (ref 24–336)

## 2020-08-08 LAB — HEPATIC FUNCTION PANEL
ALT: 44 U/L (ref 0–44)
AST: 48 U/L — ABNORMAL HIGH (ref 15–41)
Albumin: 3.9 g/dL (ref 3.5–5.0)
Alkaline Phosphatase: 54 U/L (ref 38–126)
Bilirubin, Direct: <0.1 mg/dL (ref 0.0–0.2)
Total Bilirubin: 0.5 mg/dL (ref 0.3–1.2)
Total Protein: 8 g/dL (ref 6.5–8.1)

## 2020-08-08 LAB — FIBRINOGEN: Fibrinogen: 657 mg/dL — ABNORMAL HIGH (ref 210–475)

## 2020-08-08 LAB — LACTIC ACID, PLASMA
Lactic Acid, Venous: 2.4 mmol/L (ref 0.5–1.9)
Lactic Acid, Venous: 2 mmol/L (ref 0.5–1.9)

## 2020-08-08 LAB — HEPATITIS B SURFACE ANTIGEN: Hepatitis B Surface Ag: NONREACTIVE

## 2020-08-08 LAB — BRAIN NATRIURETIC PEPTIDE: B Natriuretic Peptide: 25.8 pg/mL (ref 0.0–100.0)

## 2020-08-08 LAB — TROPONIN I (HIGH SENSITIVITY): Troponin I (High Sensitivity): 8 ng/L (ref <18)

## 2020-08-08 LAB — FIBRIN DERIVATIVES D-DIMER (ARMC ONLY): Fibrin derivatives D-dimer (ARMC): 557.09 ng/mL (FEU) — ABNORMAL HIGH (ref 0.00–499.00)

## 2020-08-08 LAB — TRIGLYCERIDES: Triglycerides: 270 mg/dL — ABNORMAL HIGH (ref <150)

## 2020-08-08 LAB — LACTATE DEHYDROGENASE: LDH: 282 U/L — ABNORMAL HIGH (ref 98–192)

## 2020-08-08 LAB — C-REACTIVE PROTEIN: CRP: 3.9 mg/dL — ABNORMAL HIGH (ref <1.0)

## 2020-08-08 LAB — PROCALCITONIN: Procalcitonin: <0.1 ng/mL

## 2020-08-08 LAB — HIV ANTIBODY (ROUTINE TESTING W REFLEX): HIV Screen 4th Generation wRfx: NONREACTIVE

## 2020-08-08 MED ORDER — DEXAMETHASONE SODIUM PHOSPHATE 10 MG/ML IJ SOLN
10.0000 mg | Freq: Once | INTRAMUSCULAR | Status: AC
  Administered 2020-08-08: 10 mg via INTRAVENOUS
  Filled 2020-08-08: qty 1

## 2020-08-08 MED ORDER — ACETAMINOPHEN 325 MG PO TABS
650.0000 mg | ORAL_TABLET | Freq: Four times a day (QID) | ORAL | Status: DC | PRN
  Administered 2020-08-12: 05:00:00 650 mg via ORAL
  Filled 2020-08-08: qty 2

## 2020-08-08 MED ORDER — ALBUTEROL SULFATE HFA 108 (90 BASE) MCG/ACT IN AERS
2.0000 | INHALATION_SPRAY | RESPIRATORY_TRACT | Status: DC | PRN
  Filled 2020-08-08: qty 6.7

## 2020-08-08 MED ORDER — ASCORBIC ACID 500 MG PO TABS
500.0000 mg | ORAL_TABLET | Freq: Every day | ORAL | Status: DC
  Administered 2020-08-08 – 2020-08-12 (×5): 500 mg via ORAL
  Filled 2020-08-08 (×5): qty 1

## 2020-08-08 MED ORDER — DM-GUAIFENESIN ER 30-600 MG PO TB12: 1.0000 | ORAL_TABLET | Freq: Two times a day (BID) | ORAL | Status: DC | PRN

## 2020-08-08 MED ORDER — ENOXAPARIN SODIUM 40 MG/0.4ML ~~LOC~~ SOLN
40.0000 mg | SUBCUTANEOUS | Status: DC
  Administered 2020-08-08: 40 mg via SUBCUTANEOUS
  Filled 2020-08-08: qty 0.4

## 2020-08-08 MED ORDER — PANTOPRAZOLE SODIUM 40 MG PO TBEC
40.0000 mg | DELAYED_RELEASE_TABLET | Freq: Every day | ORAL | Status: DC
  Administered 2020-08-09 – 2020-08-12 (×4): 40 mg via ORAL
  Filled 2020-08-08 (×4): qty 1

## 2020-08-08 MED ORDER — METHYLPREDNISOLONE SODIUM SUCC 125 MG IJ SOLR
60.0000 mg | Freq: Two times a day (BID) | INTRAMUSCULAR | Status: DC
  Administered 2020-08-09 – 2020-08-12 (×6): 60 mg via INTRAVENOUS
  Filled 2020-08-08 (×6): qty 2

## 2020-08-08 MED ORDER — ZINC SULFATE 220 (50 ZN) MG PO CAPS
220.0000 mg | ORAL_CAPSULE | Freq: Every day | ORAL | Status: DC
  Administered 2020-08-08 – 2020-08-12 (×5): 220 mg via ORAL
  Filled 2020-08-08 (×5): qty 1

## 2020-08-08 MED ORDER — ONDANSETRON HCL 4 MG/2ML IJ SOLN: 4.0000 mg | Freq: Three times a day (TID) | INTRAMUSCULAR | Status: DC | PRN

## 2020-08-08 MED ORDER — SODIUM CHLORIDE 0.9 % IV SOLN
100.0000 mg | Freq: Every day | INTRAVENOUS | Status: AC
  Administered 2020-08-09 – 2020-08-12 (×4): 100 mg via INTRAVENOUS
  Filled 2020-08-08: qty 100
  Filled 2020-08-08 (×4): qty 20

## 2020-08-08 MED ORDER — IPRATROPIUM BROMIDE HFA 17 MCG/ACT IN AERS
2.0000 | INHALATION_SPRAY | RESPIRATORY_TRACT | Status: DC
  Administered 2020-08-08 – 2020-08-12 (×20): 2 via RESPIRATORY_TRACT
  Filled 2020-08-08 (×2): qty 12.9

## 2020-08-08 MED ORDER — SODIUM CHLORIDE 0.9 % IV SOLN
200.0000 mg | Freq: Once | INTRAVENOUS | Status: AC
  Administered 2020-08-08: 200 mg via INTRAVENOUS
  Filled 2020-08-08: qty 200

## 2020-08-08 NOTE — ED Notes (Signed)
Messaged NP about pt's increased o2 need

## 2020-08-08 NOTE — ED Notes (Signed)
Iv team in with pt now 

## 2020-08-08 NOTE — ED Notes (Signed)
Refilled O2 tank applied 

## 2020-08-08 NOTE — ED Provider Notes (Signed)
[facility] Emergency Department Provider Note  ____________________________________________  Time seen: Approximately 1:15 PM  I have reviewed the triage vital signs and the nursing notes.   HISTORY  Chief Complaint Cough and Shortness of Breath    HPI Derek Wheeler. is a 34 y.o. male with a history of obesity hyperlipidemia and GERD who comes ED complaining of worsening shortness of breath and cough over the past several days.  He was seen in the ED 2 days ago, diagnosed with Covid, but oxygen saturation was maintaining in the low 90s at that time so he was discharged home with referral to infusion center and prednisone course.  However, symptoms have continued worsening, now has severe dyspnea on exertion, denies chest pain.  Symptoms are constant and severe, no alleviating factors.  Denies fever      Past Medical History:  Diagnosis Date  . Hx of dysplastic nevus 09/10/2018   R upper back, moderate atypia     Patient Active Problem List   Diagnosis Date Noted  . Pneumonia due to COVID-19 virus 08/08/2020  . GERD (gastroesophageal reflux disease) 08/08/2020  . Acute respiratory failure with hypoxia (HCC) 08/08/2020  . Acute appendicitis   . Hx of dysplastic nevus 09/10/2018  . History of Salmonella infection 03/08/2018  . Second hand smoke exposure 03/08/2018  . Class 2 obesity with body mass index (BMI) of 36.0 to 36.9 in adult 11/03/2017  . Hyperlipidemia 11/03/2017  . Right foot pain 11/03/2017     Past Surgical History:  Procedure Laterality Date  . LAPAROSCOPIC APPENDECTOMY N/A 09/23/2018   Procedure: APPENDECTOMY LAPAROSCOPIC;  Surgeon: Leafy Ro, MD;  Location: [facility] ORS;  Service: General;  Laterality: N/A;  . NO PAST SURGERIES       Prior to Admission medications   Medication Sig Start Date End Date Taking? Authorizing Provider  clindamycin-benzoyl peroxide (BENZACLIN) gel Apply topically 2 (two) times daily. 08/25/18    Margaretann Loveless, PA-C  clobetasol (TEMOVATE) 0.05 % external solution Apply 1 application topically 2 (two) times daily. 08/25/18   Margaretann Loveless, PA-C  guaiFENesin-codeine 100-10 MG/5ML syrup Take 5 mLs by mouth every 6 (six) hours as needed for cough. 08/06/20   Minna Antis, MD  hydrocortisone 2.5 % lotion Apply 1 application topically 2 (two) times daily. 09/10/18   [provider]  loratadine (CLARITIN) 10 MG tablet Take 10 mg by mouth daily.    [provider]  magnesium oxide (MAG-OX) 400 MG tablet Take 400 mg by mouth daily.    [provider]  Omega-3 Fatty Acids (FISH OIL) 1000 MG CAPS Take 1,000 mg by mouth daily.     [provider]  omeprazole (PRILOSEC) 20 MG capsule Take 1 capsule (20 mg total) by mouth daily. 06/07/20   Chrismon, Jodell Cipro, PA  ondansetron (ZOFRAN) 4 MG tablet Take 1 tablet (4 mg total) by mouth daily as needed for nausea or vomiting. 08/06/20   Minna Antis, MD  ORACEA 40 MG capsule Take 40 mg by mouth daily. 09/13/18   [provider]  phentermine (ADIPEX-P) 37.5 MG tablet Take 1 tablet (37.5 mg total) by mouth daily before breakfast. 03/12/18   Burnette, Alessandra Bevels, PA-C  predniSONE (DELTASONE) 10 MG tablet Take 1 tablet (10 mg total) by mouth daily. Day 1-3: take 4 tablets PO daily Day 4-6: take 3 tablets PO daily Day 7-9: take 2 tablets PO daily Day 10-12: take 1 tablet PO daily 08/06/20   Paduchowski,  Caryn Bee, MD  SOOLANTRA 1 % CREA Apply 1 application topically daily. 09/13/18   [provider]  tretinoin (RETIN-A) 0.025 % cream Apply topically at bedtime. 04/08/18   Margaretann Loveless, PA-C  triamcinolone lotion (KENALOG) 0.1 % Apply 1 application topically as directed. 09/13/18   [provider]     Allergies Patient has no known allergies.   Family History  Problem Relation Age of Onset  . Hypertension Mother   . Hyperlipidemia Mother   . Arthritis Father   .  Hyperlipidemia Father   . Hypertension Maternal Grandfather   . Skin cancer Maternal Grandfather     Social History Social History   Tobacco Use  . Smoking status: Never Smoker  . Smokeless tobacco: Never Used  Vaping Use  . Vaping Use: Never used  Substance Use Topics  . Alcohol use: Not Currently    Alcohol/week: 2.0 standard drinks    Types: 2 Cans of beer per week    Comment: on weekends  . Drug use: No    Review of Systems  Constitutional:   No fever or chills.  ENT:   No sore throat. No rhinorrhea. Cardiovascular:   No chest pain or syncope. Respiratory: Positive shortness of breath and cough. Gastrointestinal:   Negative for abdominal pain, vomiting and diarrhea.  Musculoskeletal:   Negative for focal pain or swelling All other systems reviewed and are negative except as documented above in ROS and HPI.  ____________________________________________   PHYSICAL EXAM:  VITAL SIGNS: ED Triage Vitals [08/07/20 1917]  Enc Vitals Group     BP 118/80     Pulse Rate (!) 111     Resp 20     Temp 99.3 F (37.4 C)     Temp Source Oral     SpO2 92 %     Weight 260 lb (117.9 kg)     Height 5\' 10"  (1.778 m)     Head Circumference      Peak Flow      Pain Score 7     Pain Loc      Pain Edu?      Excl. in GC?     Vital signs reviewed, nursing assessments reviewed.   Constitutional:   Alert and oriented. Non-toxic appearance. Eyes:   Conjunctivae are normal. EOMI. PERRL. ENT      Head:   Normocephalic and atraumatic.      Nose:   Wearing a mask.      Mouth/Throat:   Wearing a mask.      Neck:   No meningismus. Full ROM. Hematological/Lymphatic/Immunilogical:   No cervical lymphadenopathy. Cardiovascular:   Tachycardia heart rate 105. Symmetric bilateral radial and DP pulses.  No murmurs. Cap refill less than 2 seconds. Respiratory:   Tachypnea, no retractions.  Diffuse inspiratory crackles Gastrointestinal:   Soft and nontender. Non distended. There is no CVA  tenderness.  No rebound, rigidity, or guarding. Genitourinary:   deferred Musculoskeletal:   Normal range of motion in all extremities. No joint effusions.  No lower extremity tenderness.  No edema. Neurologic:   Normal speech and language.  Motor grossly intact. No acute focal neurologic deficits are appreciated.  Skin:    Skin is warm, dry and intact. No rash noted.  No petechiae, purpura, or bullae.  ____________________________________________    LABS (pertinent positives/negatives) (all labs ordered are listed, but only abnormal results are displayed) Labs Reviewed  BASIC METABOLIC PANEL - Abnormal; Notable for the following components:  Result Value   Glucose, Bld 128 (*)    Calcium 8.8 (*)    All other components within normal limits  CBC - Abnormal; Notable for the following components:   WBC 13.3 (*)    All other components within normal limits  FIBRIN DERIVATIVES D-DIMER ([facility] ONLY) - Abnormal; Notable for the following components:   Fibrin derivatives D-dimer ([facility]) 557.09 (*)    All other components within normal limits  LACTIC ACID, PLASMA  LACTIC ACID, PLASMA  PROCALCITONIN  LACTATE DEHYDROGENASE  FERRITIN  TRIGLYCERIDES  FIBRINOGEN  C-REACTIVE PROTEIN  HEPATIC FUNCTION PANEL  TROPONIN I (HIGH SENSITIVITY)  TROPONIN I (HIGH SENSITIVITY)   ____________________________________________   EKG  Interpreted by me Sinus tachycardia rate 117.  Right axis, normal intervals.  Poor R wave progression.  Normal ST segments.  Isolated T wave inversion in lead III which is nonspecific.  ____________________________________________    RADIOLOGY  DG Chest 2 View  Result Date: 08/08/2020 CLINICAL DATA:  Cough, COVID, shortness of breath EXAM: CHEST - 2 VIEW COMPARISON:  08/06/2020 FINDINGS: Patchy bilateral airspace opacities, increasing since prior study. Heart is normal size. Low lung volumes. No effusions or pneumothorax. No acute bony abnormality. IMPRESSION:  Patchy bilateral airspace disease compatible with COVID pneumonia, worsening since prior study. Electronically Signed   By: Charlett Nose M.D.   On: 08/08/2020 00:14    ____________________________________________   PROCEDURES .Critical Care Performed by: Sharman Cheek, MD Authorized by: Sharman Cheek, MD   Critical care provider statement:    Critical care time (minutes):  33   Critical care time was exclusive of:  Separately billable procedures and treating other patients   Critical care was necessary to treat or prevent imminent or life-threatening deterioration of the following conditions:  Respiratory failure   Critical care was time spent personally by me on the following activities:  Development of treatment plan with patient or surrogate, discussions with consultants, evaluation of patient's response to treatment, examination of patient, obtaining history from patient or surrogate, ordering and performing treatments and interventions, ordering and review of laboratory studies, ordering and review of radiographic studies, pulse oximetry, re-evaluation of patient's condition and review of old charts    ____________________________________________    CLINICAL IMPRESSION / ASSESSMENT AND PLAN / ED COURSE  Medications ordered in the ED: Medications  dexamethasone (DECADRON) injection 10 mg (has no administration in time range)  remdesivir 200 mg in sodium chloride 0.9% 250 mL IVPB (has no administration in time range)    Followed by  remdesivir 100 mg in sodium chloride 0.9 % 100 mL IVPB (has no administration in time range)    Pertinent labs & imaging results that were available during my care of the patient were reviewed by me and considered in my medical decision making (see chart for details).  [name]. was evaluated in Emergency Department on 08/08/2020 for the symptoms described in the history of present illness. He was evaluated in the context of the global  COVID-19 pandemic, which necessitated consideration that the patient might be at risk for infection with the SARS-CoV-2 virus that causes COVID-19. Institutional protocols and algorithms that pertain to the evaluation of patients at risk for COVID-19 are in a state of rapid change based on information released by regulatory bodies including the CDC and federal and state organizations. These policies and algorithms were followed during the patient's care in the ED.   Patient presents with worsening shortness of breath and cough for the past several  days, found to be hypoxic here.  On my exam his room air oxygen saturation is 88% at rest, normalized on 2 or 3 L nasal cannula oxygen.   Considering the patient's symptoms, medical history, and physical examination today, I have low suspicion for ACS, PE, TAD, pneumothorax, carditis, mediastinitis, bacterial pneumonia, CHF, or sepsis.  I will order remdesivir, Decadron for Covid pneumonia, IV fluids for hydration which I think is causing the tachycardia.  Case discussed with the hospitalist for further management.      ____________________________________________   FINAL CLINICAL IMPRESSION(S) / ED DIAGNOSES    Final diagnoses:  Pneumonia due to COVID-19 virus  Acute respiratory failure with hypoxia [facility](HCC)     ED Discharge Orders    None      Portions of this note were generated with dragon dictation software. Dictation errors may occur despite best attempts at proofreading.   Sharman CheekStafford, Sahir Tolson, MD 08/08/20 1318

## 2020-08-08 NOTE — ED Notes (Signed)
New oxygen tank given 

## 2020-08-08 NOTE — Progress Notes (Signed)
Remdesivir - Pharmacy Brief Note   O:  ALT: 28 CXR: Patchy bilateral airspace disease compatible with COVID pneumonia, worsening since prior study. SpO2: 90% on 2L    A/P:  Remdesivir 200 mg IVPB once followed by 100 mg IVPB daily x 4 days.   Reatha Armour, PharmD Pharmacy Resident  08/08/2020 1:10 PM

## 2020-08-08 NOTE — ED Notes (Signed)
Iv meds given.  Pt alert.  nsr on monitor.  Pt is on 4 liters oxygen.

## 2020-08-08 NOTE — ED Notes (Signed)
One set blood cultures sent

## 2020-08-08 NOTE — H&P (Signed)
History and Physical    Derek Wheeler. FYB:[id number] DOB: October 05, 1986 DOA: 08/08/2020  Referring MD/NP/PA:   PCP: Margaretann Loveless, PA-C   Patient coming from:  The patient is coming from home.  At baseline, pt is independent for most of ADL.        Chief Complaint: SOB  HPI: Derek Wheeler. is a 34 y.o. male with medical history significant of hyperlipidemia, GERD, dysplastic nevus, obesity, who presents with shortness of breath.  Patient states that he has been having shortness breath for almost a week, which has been progressively worsening.  Patient has a dry cough, no chest pain.  Patient had fever and chills in the 1st several days, which has resolved.  Currently no fever or chills.  Patient does not have nausea vomiting, diarrhea or abdominal pain.  No symptoms of UTI or unilateral weakness. Patient was seen in the ED on 9/6 and was diagnosed with COVID-19 infection.  Patient was started with prednisone tapering, but short of breath has been continuously worsening. Oxygen desaturation to 87% on room air, which improved to 94% on 2 L nasal cannula oxygen.  ED Course: pt was found to have WBC 13.3, troponin 9, 8, electrolytes renal function okay, temperature normal, blood pressure 116/75, tachycardia with heart rate 111 --> 100, tachypnea with RR 22, chest x-ray showed bilateral patchy infiltration.  Patient is admitted to MedSurg bed as inpatient.  Review of Systems:   General: had fevers, chills, no body weight gain, has poor appetite, has fatigue HEENT: no blurry vision, hearing changes or sore throat Respiratory: has dyspnea, coughing, no wheezing CV: no chest pain, no palpitations GI: no nausea, vomiting, abdominal pain, diarrhea, constipation GU: no dysuria, burning on urination, increased urinary frequency, hematuria  Ext: no leg edema Neuro: no unilateral weakness, numbness, or tingling, no vision change or hearing loss Skin: no rash, no skin tear. MSK: No muscle  spasm, no deformity, no limitation of range of movement in spin Heme: No easy bruising.  Travel history: No recent long distant travel.  Allergy: No Known Allergies  Past Medical History:  Diagnosis Date  . Hx of dysplastic nevus 09/10/2018   R upper back, moderate atypia    Past Surgical History:  Procedure Laterality Date  . LAPAROSCOPIC APPENDECTOMY N/A 09/23/2018   Procedure: APPENDECTOMY LAPAROSCOPIC;  Surgeon: Leafy Ro, MD;  Location: [facility] ORS;  Service: General;  Laterality: N/A;  . NO PAST SURGERIES      Social History:  reports that he has never smoked. He has never used smokeless tobacco. He reports previous alcohol use of about 2.0 standard drinks of alcohol per week. He reports that he does not use drugs.  Family History:  Family History  Problem Relation Age of Onset  . Hypertension Mother   . Hyperlipidemia Mother   . Arthritis Father   . Hyperlipidemia Father   . Hypertension Maternal Grandfather   . Skin cancer Maternal Grandfather      Prior to Admission medications   Medication Sig Start Date End Date Taking? Authorizing Provider  clindamycin-benzoyl peroxide (BENZACLIN) gel Apply topically 2 (two) times daily. 08/25/18   Margaretann Loveless, PA-C  clobetasol (TEMOVATE) 0.05 % external solution Apply 1 application topically 2 (two) times daily. 08/25/18   Margaretann Loveless, PA-C  guaiFENesin-codeine 100-10 MG/5ML syrup Take 5 mLs by mouth every 6 (six) hours as needed for cough. 08/06/20   Minna Antis, MD  hydrocortisone 2.5 % lotion Apply 1 application  topically 2 (two) times daily. 09/10/18   [provider]  loratadine (CLARITIN) 10 MG tablet Take 10 mg by mouth daily.    [provider]  magnesium oxide (MAG-OX) 400 MG tablet Take 400 mg by mouth daily.    [provider]  Omega-3 Fatty Acids (FISH OIL) 1000 MG CAPS Take 1,000 mg by mouth daily.     [provider]  omeprazole (PRILOSEC) 20 MG capsule Take  1 capsule (20 mg total) by mouth daily. 06/07/20   Chrismon, Jodell Cipro, PA  ondansetron (ZOFRAN) 4 MG tablet Take 1 tablet (4 mg total) by mouth daily as needed for nausea or vomiting. 08/06/20   Minna Antis, MD  ORACEA 40 MG capsule Take 40 mg by mouth daily. 09/13/18   [provider]  phentermine (ADIPEX-P) 37.5 MG tablet Take 1 tablet (37.5 mg total) by mouth daily before breakfast. 03/12/18   Burnette, Alessandra Bevels, PA-C  predniSONE (DELTASONE) 10 MG tablet Take 1 tablet (10 mg total) by mouth daily. Day 1-3: take 4 tablets PO daily Day 4-6: take 3 tablets PO daily Day 7-9: take 2 tablets PO daily Day 10-12: take 1 tablet PO daily 08/06/20   Minna Antis, MD  SOOLANTRA 1 % CREA Apply 1 application topically daily. 09/13/18   [provider]  tretinoin (RETIN-A) 0.025 % cream Apply topically at bedtime. 04/08/18   Margaretann Loveless, PA-C  triamcinolone lotion (KENALOG) 0.1 % Apply 1 application topically as directed. 09/13/18   [provider]    Physical Exam: Vitals:   08/08/20 1630 08/08/20 1645 08/08/20 1700 08/08/20 1715  BP: 121/76  114/74   Pulse: 96 91 86 84  Resp: (!) 29 (!) 28 (!) 27 (!) 28  Temp:      TempSrc:      SpO2: 93% 90% (!) 89% 91%  Weight:      Height:       General: Not in acute distress HEENT:       Eyes: PERRL, EOMI, no scleral icterus.       ENT: No discharge from the ears and nose, no pharynx injection, no tonsillar enlargement.        Neck: No JVD, no bruit, no mass felt. Heme: No neck lymph node enlargement. Cardiac: S1/S2, RRR, No murmurs, No gallops or rubs. Respiratory: No rales, wheezing, rhonchi or rubs. GI: Soft, nondistended, nontender, no rebound pain, no organomegaly, BS present. GU: No hematuria Ext: No pitting leg edema bilaterally. 2+DP/PT pulse bilaterally. Musculoskeletal: No joint deformities, No joint redness or warmth, no limitation of ROM in spin. Skin: No rashes.  Neuro: Alert, oriented X3, cranial  nerves II-XII grossly intact, moves all extremities normally.  Psych: Patient is not psychotic, no suicidal or hemocidal ideation.  Labs on Admission: I have personally reviewed following labs and imaging studies  CBC: Recent Labs  Lab 08/06/20 1254 08/07/20 1920  WBC 7.4 13.3*  HGB 14.6 14.7  HCT 41.3 42.5  MCV 82.1 83.8  PLT 240 340   Basic Metabolic Panel: Recent Labs  Lab 08/06/20 1254 08/07/20 1920  NA 136 139  K 3.4* 3.5  CL 101 106  CO2 24 24  GLUCOSE 112* 128*  BUN 11 13  CREATININE 0.93 0.85  CALCIUM 8.4* 8.8*   GFR: Estimated Creatinine Clearance: 160.6 mL/min (by C-G formula based on SCr of 0.85 mg/dL). Liver Function Tests: Recent Labs  Lab 08/06/20 1254 08/08/20 1537  AST 31 48*  ALT 28 44  ALKPHOS  60 54  BILITOT 0.6 0.5  PROT 7.7 8.0  ALBUMIN 4.3 3.9   No results for input(s): LIPASE, AMYLASE in the last 168 hours. No results for input(s): AMMONIA in the last 168 hours. Coagulation Profile: No results for input(s): INR, PROTIME in the last 168 hours. Cardiac Enzymes: No results for input(s): CKTOTAL, CKMB, CKMBINDEX, TROPONINI in the last 168 hours. BNP (last 3 results) No results for input(s): PROBNP in the last 8760 hours. HbA1C: No results for input(s): HGBA1C in the last 72 hours. CBG: No results for input(s): GLUCAP in the last 168 hours. Lipid Profile: Recent Labs    08/08/20 1537  TRIG 270*   Thyroid Function Tests: No results for input(s): TSH, T4TOTAL, FREET4, T3FREE, THYROIDAB in the last 72 hours. Anemia Panel: Recent Labs    08/08/20 1537  FERRITIN 636*   Urine analysis: No results found for: COLORURINE, APPEARANCEUR, LABSPEC, PHURINE, GLUCOSEU, HGBUR, BILIRUBINUR, KETONESUR, PROTEINUR, UROBILINOGEN, NITRITE, LEUKOCYTESUR Sepsis Labs: @LABRCNTIP (procalcitonin:4,lacticidven:4) ) Recent Results (from the past 240 hour(s))  SARS Coronavirus 2 by RT PCR (hospital order, performed in [facility] hospital lab)  Nasopharyngeal Nasopharyngeal Swab     Status: Abnormal   Collection Time: 08/06/20 12:54 PM   Specimen: Nasopharyngeal Swab  Result Value Ref Range Status   SARS Coronavirus 2 POSITIVE (A) NEGATIVE Final    Comment: RESULT CALLED TO, READ BACK BY AND VERIFIED WITH: ANNA JASPER AT 1411 ON 08/06/2020 [facility]. (NOTE) SARS-CoV-2 target nucleic acids are DETECTED  SARS-CoV-2 RNA is generally detectable in upper respiratory specimens  during the acute phase of infection.  Positive results are indicative  of the presence of the identified virus, but do not rule out bacterial infection or co-infection with other pathogens not detected by the test.  Clinical correlation with patient history and  other diagnostic information is necessary to determine patient infection status.  The expected result is negative.  Fact Sheet for Patients:   10/06/2020   Fact Sheet for Healthcare Providers:   BoilerBrush.com.cy    This test is not yet approved or cleared by the https://pope.com/ FDA and  has been authorized for detection and/or diagnosis of SARS-CoV-2 by FDA under an Emergency Use Authorization (EUA).  This EUA will remain in effect (meaning this  test can be used) for the duration of  the COVID-19 declaration under Section 564(b)(1) of the Act, 21 U.S.C. section 360-bbb-3(b)(1), unless the authorization is terminated or revoked sooner.  Performed at [facility], [address] [place], [place]      Radiological Exams on Admission: DG Chest 2 View  Result Date: 08/08/2020 CLINICAL DATA:  Cough, COVID, shortness of breath EXAM: CHEST - 2 VIEW COMPARISON:  08/06/2020 FINDINGS: Patchy bilateral airspace opacities, increasing since prior study. Heart is normal size. Low lung volumes. No effusions or pneumothorax. No acute bony abnormality. IMPRESSION: Patchy bilateral airspace disease compatible with COVID pneumonia, worsening  since prior study. Electronically Signed   By: 10/06/2020 M.D.   On: 08/08/2020 00:14     EKG: Independently reviewed.  Sinus tachycardia, QTC 440, poor R wave progression  Assessment/Plan Principal Problem:   Pneumonia due to COVID-19 virus Active Problems:   Class 2 obesity with body mass index (BMI) of 36.0 to 36.9 in adult   GERD (gastroesophageal reflux disease)   Acute respiratory failure with hypoxia (HCC)   Pneumonia due to COVID-19 virus and acute respiratory failure with hypoxia: Oxygen desaturation to 87% on room air, which improved to 94% on  2 L nasal cannula oxygen.  Chest x-ray showed bilateral patchy infiltration.  Patient was not Covid vaccinated.  -will admit to med-surg bed as inpt -Remdesivir per pharm -Solumedrol 60 mg bid -vitamin C, zinc.  -Bronchodilators -PRN Mucinex for cough -f/u Blood culture -D-dimer, BNP,Trop, LFT, CRP, LDH, Procalcitonin, Ferritin, fibinogen, TG, Hep B SAg, HIV ab -Daily CRP, Ferritin, D-dimer, -Will ask the patient to maintain an awake prone position for 16+ hours a day, if possible, with a minimum of 2-3 hours at a time -Will attempt to maintain euvolemia to a net negative fluid status -IF patient deteriorates, will consult PCCM and ID  Class 2 obesity with body mass index (BMI) of 36.0 to 36.9 in adult -Diet and exercise.   -Encouraged to lose weight.   GERD (gastroesophageal reflux disease) -protonix    DVT ppx: SQ Lovenox Code Status: Full code Family Communication:  Yes, patient's fianc by phone Disposition Plan:  Anticipate discharge back to previous environment Consults called:  none Admission status: Med-surg bed as inpt      Status is: Inpatient  Remains inpatient appropriate because:Inpatient level of care appropriate due to severity of illness.  Patient has multiple comorbidities, now presents with pneumonia due to COVID-19 infection.  Patient has acute respiratory failure, need 2 L nasal cannula oxygen  currently.  Chest x-ray is positive for bilateral patchy infiltration.  His presentation is highly complicated.  Patient is at high risk of deteriorating.  Will need to be treated in the hospital for at least 2 days.   Dispo: The patient is from: Home              Anticipated d/c is to: Home              Anticipated d/c date is: 2 days              Patient currently is not medically stable to d/c.           Date of Service 08/08/2020    Lorretta HarpXilin Tynlee Bayle [facility]   If 7PM-7AM, please contact night-coverage [email or web address] 08/08/2020, 6:35 PM

## 2020-08-08 NOTE — ED Notes (Signed)
Iv meds given.  

## 2020-08-08 NOTE — ED Notes (Signed)
PT given snacks and drink. Lights turned down for comfort.

## 2020-08-09 DIAGNOSIS — J1282 Pneumonia due to coronavirus disease 2019: Secondary | ICD-10-CM

## 2020-08-09 DIAGNOSIS — U071 COVID-19: Principal | ICD-10-CM

## 2020-08-09 LAB — CBC WITH DIFFERENTIAL/PLATELET
Abs Immature Granulocytes: 0.05 10*3/uL (ref 0.00–0.07)
Basophils Absolute: 0 10*3/uL (ref 0.0–0.1)
Basophils Relative: 0 %
Eosinophils Absolute: 0 10*3/uL (ref 0.0–0.5)
Eosinophils Relative: 0 %
HCT: 39.5 % (ref 39.0–52.0)
Hemoglobin: 13.7 g/dL (ref 13.0–17.0)
Immature Granulocytes: 1 %
Lymphocytes Relative: 13 %
Lymphs Abs: 1.1 10*3/uL (ref 0.7–4.0)
MCH: 29.4 pg (ref 26.0–34.0)
MCHC: 34.7 g/dL (ref 30.0–36.0)
MCV: 84.8 fL (ref 80.0–100.0)
Monocytes Absolute: 0.4 10*3/uL (ref 0.1–1.0)
Monocytes Relative: 5 %
Neutro Abs: 7 10*3/uL (ref 1.7–7.7)
Neutrophils Relative %: 81 %
Platelets: 403 10*3/uL — ABNORMAL HIGH (ref 150–400)
RBC: 4.66 MIL/uL (ref 4.22–5.81)
RDW: 14 % (ref 11.5–15.5)
WBC: 8.7 10*3/uL (ref 4.0–10.5)
nRBC: 0 % (ref 0.0–0.2)

## 2020-08-09 LAB — COMPREHENSIVE METABOLIC PANEL
ALT: 70 U/L — ABNORMAL HIGH (ref 0–44)
AST: 70 U/L — ABNORMAL HIGH (ref 15–41)
Albumin: 3.8 g/dL (ref 3.5–5.0)
Alkaline Phosphatase: 60 U/L (ref 38–126)
Anion gap: 11 (ref 5–15)
BUN: 17 mg/dL (ref 6–20)
CO2: 24 mmol/L (ref 22–32)
Calcium: 8.9 mg/dL (ref 8.9–10.3)
Chloride: 108 mmol/L (ref 98–111)
Creatinine, Ser: 0.91 mg/dL (ref 0.61–1.24)
GFR calc Af Amer: >60 mL/min (ref >60)
GFR calc non Af Amer: >60 mL/min (ref >60)
Glucose, Bld: 138 mg/dL — ABNORMAL HIGH (ref 70–99)
Potassium: 3.6 mmol/L (ref 3.5–5.1)
Sodium: 143 mmol/L (ref 135–145)
Total Bilirubin: 0.7 mg/dL (ref 0.3–1.2)
Total Protein: 7.8 g/dL (ref 6.5–8.1)

## 2020-08-09 LAB — FIBRIN DERIVATIVES D-DIMER (ARMC ONLY): Fibrin derivatives D-dimer (ARMC): 873.41 ng/mL (FEU) — ABNORMAL HIGH (ref 0.00–499.00)

## 2020-08-09 LAB — FERRITIN: Ferritin: 834 ng/mL — ABNORMAL HIGH (ref 24–336)

## 2020-08-09 LAB — C-REACTIVE PROTEIN: CRP: 1.8 mg/dL — ABNORMAL HIGH (ref <1.0)

## 2020-08-09 MED ORDER — ENOXAPARIN SODIUM 60 MG/0.6ML ~~LOC~~ SOLN
0.5000 mg/kg | SUBCUTANEOUS | Status: DC
  Administered 2020-08-09 – 2020-08-11 (×3): 60 mg via SUBCUTANEOUS
  Filled 2020-08-09 (×5): qty 0.6

## 2020-08-09 NOTE — ED Notes (Signed)
PT rang out for assistance to prone

## 2020-08-09 NOTE — ED Notes (Signed)
Pt changed and used urinal, clothes put in pt belonging bag

## 2020-08-09 NOTE — ED Notes (Signed)
Pt educated on proning and is doing so at this time

## 2020-08-09 NOTE — Plan of Care (Signed)
Report called and s/w Marchelle Folks, RN on 1c.  Patient transferring to room 118.

## 2020-08-09 NOTE — Progress Notes (Signed)
PROGRESS NOTE    Derek Wheeler.  LOV:[id number] DOB: 08/12/1986 DOA: 08/08/2020 PCP: Margaretann Loveless, PA-C   Brief Narrative:  Derek Spang. is a 34 y.o. male with medical history significant of hyperlipidemia, GERD, dysplastic nevus, obesity, who presents with shortness of breath.  Patient states that he has been having shortness breath for almost a week, which has been progressively worsening.  Patient has a dry cough, no chest pain.  Patient had fever and chills in the 1st several days, which has resolved.  Currently no fever or chills.  Patient does not have nausea vomiting, diarrhea or abdominal pain.  No symptoms of UTI or unilateral weakness. Patient was seen in the ED on 9/6 and was diagnosed with COVID-19 infection.  Patient was started with prednisone tapering, but short of breath has been continuously worsening. Oxygen desaturation to 87% on room air, which improved to 94% on 2 L nasal cannula oxygen.  ED Course: pt was found to have WBC 13.3, troponin 9, 8, electrolytes renal function okay, temperature normal, blood pressure 116/75, tachycardia with heart rate 111 --> 100, tachypnea with RR 22, chest x-ray showed bilateral patchy infiltration.  Patient is admitted to MedSurg bed as inpatient.  Assessment & Plan:   Principal Problem:   Pneumonia due to COVID-19 virus Active Problems:   Class 2 obesity with body mass index (BMI) of 36.0 to 36.9 in adult   GERD (gastroesophageal reflux disease)   Acute respiratory failure with hypoxia (HCC) Pneumonia due to COVID-19 virus and acute respiratory failure with hypoxia: Oxygen desaturation to 87% on room air, which improved to 94% on 2 L nasal cannula oxygen.  Chest x-ray showed bilateral patchy infiltration.  Patient was not Covid vaccinated.  -will admit to med-surg bed as inpt -Remdesivir per pharm -Solumedrol 60 mg bid -vitamin C, zinc.  -Bronchodilators -PRN Mucinex for cough -f/u Blood culture -D-dimer, BNP,Trop,  LFT, CRP, LDH, Procalcitonin, Ferritin, fibinogen, TG, Hep B SAg, HIV ab -Daily CRP, Ferritin, D-dimer, -Will ask the patient to maintain an awake prone position for 16+ hours a day, if possible, with a minimum of 2-3 hours at a time -Will attempt to maintain euvolemia to a net negative fluid status -IF patient deteriorates, will consult PCCM and ID  Class 2 obesity with body mass index (BMI) of 36.0 to 36.9 in adult -Diet and exercise.  -Encouraged to lose weight.  GERD (gastroesophageal reflux disease) -protonix    DVT ppx: SQ Lovenox Code Status: Full code Family Communication:  Yes, patient's fianc by phone Disposition Plan:  Anticipate discharge back to previous environment Consults called:  none Admission status: Med-surg bed as inpt      Status is: Inpatient  Subjective: Pt seen today he is stable clinically, vitals and labs are stable.  Pt deneis any complaints of chest pain or sob.    Objective: Vitals:   08/08/20 2320 08/09/20 0100 08/09/20 0400 08/09/20 0500  BP:  124/72 111/66 119/61  Pulse:  (!) 106 83 74  Resp:  (!) 29 (!) 28 20  Temp: 98.1 F (36.7 C)     TempSrc: Oral     SpO2:  92% 93% 92%  Weight:      Height:       No intake or output data in the 24 hours ending 08/09/20 1752 Filed Weights   08/07/20 1917  Weight: 117.9 kg    Examination: Blood pressure 119/61, pulse 74, temperature 98.1 F (36.7 C), temperature source Oral, resp.  rate 20, height 5\' 10"  (1.778 m), weight 117.9 kg, SpO2 92 %.  General exam: Appears calm and comfortable  Respiratory system: Clear to auscultation. Respiratory effort normal. Cardiovascular system: S1 & S2 heard, RRR. No JVD, murmurs, rubs, gallops or clicks. No pedal edema. Gastrointestinal system: Abdomen is nondistended, soft and nontender. No organomegaly or masses felt. Normal bowel sounds heard. Central nervous system: Alert and oriented. No focal neurological deficits. Extremities: Symmetric 5 x 5  power. Skin: No rashes, lesions or ulcers Psychiatry: Judgement and insight appear normal. Mood & affect appropriate.   Data Reviewed: I have personally reviewed following labs and imaging studies  No intake/output data recorded. No intake/output data recorded. Lab Results  Component Value Date   CREATININE 0.91 08/09/2020   CREATININE 0.85 08/07/2020   CREATININE 0.93 08/06/2020   CBC: Recent Labs  Lab 08/06/20 1254 08/07/20 1920 08/09/20 0314  WBC 7.4 13.3* 8.7  NEUTROABS  --   --  7.0  HGB 14.6 14.7 13.7  HCT 41.3 42.5 39.5  MCV 82.1 83.8 84.8  PLT 240 340 403*   Basic Metabolic Panel: Recent Labs  Lab 08/06/20 1254 08/07/20 1920 08/09/20 0314  NA 136 139 143  K 3.4* 3.5 3.6  CL 101 106 108  CO2 24 24 24   GLUCOSE 112* 128* 138*  BUN 11 13 17   CREATININE 0.93 0.85 0.91  CALCIUM 8.4* 8.8* 8.9   GFR: Estimated Creatinine Clearance: 150 mL/min (by C-G formula based on SCr of 0.91 mg/dL). Liver Function Tests: Recent Labs  Lab 08/06/20 1254 08/08/20 1537 08/09/20 0314  AST 31 48* 70*  ALT 28 44 70*  ALKPHOS 60 54 60  BILITOT 0.6 0.5 0.7  PROT 7.7 8.0 7.8  ALBUMIN 4.3 3.9 3.8  Lipid Profile: Recent Labs    08/08/20 1537  TRIG 270*   Thyroid Function Tests: No results for input(s): TSH, T4TOTAL, FREET4, T3FREE, THYROIDAB in the last 72 hours. Anemia Panel: Recent Labs    08/08/20 1537 08/09/20 0314  FERRITIN 636* 834*   Sepsis Labs: Recent Labs  Lab 08/08/20 1537 08/08/20 1612  PROCALCITON <0.10  --   LATICACIDVEN 2.4* 2.0*    Recent Results (from the past 240 hour(s))  SARS Coronavirus 2 by RT PCR (hospital order, performed in [facility] hospital lab) Nasopharyngeal Nasopharyngeal Swab     Status: Abnormal   Collection Time: 08/06/20 12:54 PM   Specimen: Nasopharyngeal Swab  Result Value Ref Range Status   SARS Coronavirus 2 POSITIVE (A) NEGATIVE Final    Comment: RESULT CALLED TO, READ BACK BY AND VERIFIED WITH: ANNA JASPER AT 1411  ON 08/06/2020 [facility]. (NOTE) SARS-CoV-2 target nucleic acids are DETECTED  SARS-CoV-2 RNA is generally detectable in upper respiratory specimens  during the acute phase of infection.  Positive results are indicative  of the presence of the identified virus, but do not rule out bacterial infection or co-infection with other pathogens not detected by the test.  Clinical correlation with patient history and  other diagnostic information is necessary to determine patient infection status.  The expected result is negative.  Fact Sheet for Patients:   [facility]   Fact Sheet for Healthcare Providers:   10/06/20    This test is not yet approved or cleared by the 10/06/2020 FDA and  has been authorized for detection and/or diagnosis of SARS-CoV-2 by FDA under an Emergency Use Authorization (EUA).  This EUA will remain in effect (meaning this  test can be used) for the duration  of  the COVID-19 declaration under Section 564(b)(1) of the Act, 21 U.S.C. section 360-bbb-3(b)(1), unless the authorization is terminated or revoked sooner.  Performed at [facility], [address] [place], [place] [address]   Culture, blood (Routine X 2) w Reflex to ID Panel     Status: None (Preliminary result)   Collection Time: 08/08/20 12:53 PM   Specimen: BLOOD  Result Value Ref Range Status   Specimen Description BLOOD L FA  Final   Special Requests   Final    BOTTLES DRAWN AEROBIC AND ANAEROBIC Blood Culture results may not be optimal due to an inadequate volume of blood received in culture bottles   Culture   Final    NO GROWTH < 24 HOURS Performed at [facility], [address], [place], [place] [address]    Report Status PENDING  Incomplete  Culture, blood (Routine X 2) w Reflex to ID Panel     Status: None (Preliminary result)   Collection Time: 08/08/20  4:12 PM   Specimen: BLOOD  Result Value Ref Range Status    Specimen Description BLOOD BLOOD RIGHT HAND  Final   Special Requests   Final    BOTTLES DRAWN AEROBIC AND ANAEROBIC Blood Culture adequate volume   Culture   Final    NO GROWTH < 24 HOURS Performed at [facility], [address] [place], [place] [address]    Report Status PENDING  Incomplete      Radiology Studies: DG Chest 2 View  Result Date: 08/08/2020 CLINICAL DATA:  Cough, COVID, shortness of breath EXAM: CHEST - 2 VIEW COMPARISON:  08/06/2020 FINDINGS: Patchy bilateral airspace opacities, increasing since prior study. Heart is normal size. Low lung volumes. No effusions or pneumothorax. No acute bony abnormality. IMPRESSION: Patchy bilateral airspace disease compatible with COVID pneumonia, worsening since prior study. Electronically Signed   By: Charlett Nose M.D.   On: 08/08/2020 00:14    Scheduled Meds: . vitamin C  500 mg Oral Daily  . enoxaparin (LOVENOX) injection  0.5 mg/kg Subcutaneous Q24H  . ipratropium  2 puff Inhalation Q4H  . methylPREDNISolone (SOLU-MEDROL) injection  60 mg Intravenous Q12H  . pantoprazole  40 mg Oral Daily  . zinc sulfate  220 mg Oral Daily   Continuous Infusions: . remdesivir 100 mg in NS 100 mL Stopped (08/09/20 0930)     LOS: 1 day    Gertha Calkin, MD [facility] Pager [phone] If 7PM-7AM, please contact night-coverage [email or web address] Password [email or web address] 08/09/2020, 5:52 PM

## 2020-08-10 LAB — COMPREHENSIVE METABOLIC PANEL
ALT: 186 U/L — ABNORMAL HIGH (ref 0–44)
AST: 102 U/L — ABNORMAL HIGH (ref 15–41)
Albumin: 3.6 g/dL (ref 3.5–5.0)
Alkaline Phosphatase: 59 U/L (ref 38–126)
Anion gap: 12 (ref 5–15)
BUN: 21 mg/dL — ABNORMAL HIGH (ref 6–20)
CO2: 26 mmol/L (ref 22–32)
Calcium: 8.8 mg/dL — ABNORMAL LOW (ref 8.9–10.3)
Chloride: 107 mmol/L (ref 98–111)
Creatinine, Ser: 0.89 mg/dL (ref 0.61–1.24)
GFR calc Af Amer: >60 mL/min (ref >60)
GFR calc non Af Amer: >60 mL/min (ref >60)
Glucose, Bld: 196 mg/dL — ABNORMAL HIGH (ref 70–99)
Potassium: 3.7 mmol/L (ref 3.5–5.1)
Sodium: 145 mmol/L (ref 135–145)
Total Bilirubin: 0.9 mg/dL (ref 0.3–1.2)
Total Protein: 7.1 g/dL (ref 6.5–8.1)

## 2020-08-10 LAB — CBC WITH DIFFERENTIAL/PLATELET
Abs Immature Granulocytes: 0.16 10*3/uL — ABNORMAL HIGH (ref 0.00–0.07)
Basophils Absolute: 0 10*3/uL (ref 0.0–0.1)
Basophils Relative: 0 %
Eosinophils Absolute: 0 10*3/uL (ref 0.0–0.5)
Eosinophils Relative: 0 %
HCT: 38.1 % — ABNORMAL LOW (ref 39.0–52.0)
Hemoglobin: 13.3 g/dL (ref 13.0–17.0)
Immature Granulocytes: 1 %
Lymphocytes Relative: 13 %
Lymphs Abs: 1.6 10*3/uL (ref 0.7–4.0)
MCH: 28.8 pg (ref 26.0–34.0)
MCHC: 34.9 g/dL (ref 30.0–36.0)
MCV: 82.5 fL (ref 80.0–100.0)
Monocytes Absolute: 0.2 10*3/uL (ref 0.1–1.0)
Monocytes Relative: 2 %
Neutro Abs: 9.8 10*3/uL — ABNORMAL HIGH (ref 1.7–7.7)
Neutrophils Relative %: 84 %
Platelets: 451 10*3/uL — ABNORMAL HIGH (ref 150–400)
RBC: 4.62 MIL/uL (ref 4.22–5.81)
RDW: 13.8 % (ref 11.5–15.5)
WBC: 11.8 10*3/uL — ABNORMAL HIGH (ref 4.0–10.5)
nRBC: 0 % (ref 0.0–0.2)

## 2020-08-10 LAB — C-REACTIVE PROTEIN: CRP: 0.6 mg/dL (ref <1.0)

## 2020-08-10 LAB — FIBRIN DERIVATIVES D-DIMER (ARMC ONLY): Fibrin derivatives D-dimer (ARMC): 774.67 ng/mL (FEU) — ABNORMAL HIGH (ref 0.00–499.00)

## 2020-08-10 LAB — FERRITIN: Ferritin: 883 ng/mL — ABNORMAL HIGH (ref 24–336)

## 2020-08-10 MED ORDER — ORAL CARE MOUTH RINSE
15.0000 mL | Freq: Two times a day (BID) | OROMUCOSAL | Status: DC
  Administered 2020-08-10 – 2020-08-12 (×5): 15 mL via OROMUCOSAL

## 2020-08-10 NOTE — Progress Notes (Signed)
PROGRESS NOTE    Derek Wheeler.  MBW:[id number] DOB: 1985/12/07 DOA: 08/08/2020 PCP: Margaretann Loveless, PA-C   Brief Narrative:  Derek Wheeler. is a 34 y.o. male with medical history significant of hyperlipidemia, GERD, dysplastic nevus, obesity, who presents with shortness of breath.  Patient states that he has been having shortness breath for almost a week, which has been progressively worsening.  Patient has a dry cough, no chest pain.  Patient had fever and chills in the 1st several days, which has resolved.  Currently no fever or chills.  Patient does not have nausea vomiting, diarrhea or abdominal pain.  No symptoms of UTI or unilateral weakness. Patient was seen in the ED on 9/6 and was diagnosed with COVID-19 infection.  Patient was started with prednisone tapering, but short of breath has been continuously worsening. Oxygen desaturation to 87% on room air, which improved to 94% on 2 L nasal cannula oxygen.  ED Course: pt was found to have WBC 13.3, troponin 9, 8, electrolytes renal function okay, temperature normal, blood pressure 116/75, tachycardia with heart rate 111 --> 100, tachypnea with RR 22, chest x-ray showed bilateral patchy infiltration.  Patient is admitted to MedSurg bed as inpatient.  Assessment & Plan:   Principal Problem:   Pneumonia due to COVID-19 virus Active Problems:   Class 2 obesity with body mass index (BMI) of 36.0 to 36.9 in adult   GERD (gastroesophageal reflux disease)   Acute respiratory failure with hypoxia (HCC) Pneumonia due to COVID-19 virus and acute respiratory failure with hypoxia: Oxygen desaturation to 87% on room air, which improved to 94% on 2 L nasal cannula oxygen.  Chest x-ray showed bilateral patchy infiltration.  Patient was not Covid vaccinated.  -will admit to med-surg bed as inpt -Remdesivir per pharm -Solumedrol 60 mg bid -vitamin C, zinc.  -Bronchodilators -PRN Mucinex for cough -f/u Blood culture -D-dimer, BNP,Trop,  LFT, CRP, LDH, Procalcitonin, Ferritin, fibinogen, TG, Hep B SAg, HIV ab -Daily CRP, Ferritin, D-dimer, -Will ask the patient to maintain an awake prone position for 16+ hours a day, if possible, with a minimum of 2-3 hours at a time -Will attempt to maintain euvolemia to a net negative fluid status -IF patient deteriorates, will consult PCCM and ID.  -pt is stable and denies any complaints today, reports cough only.  SpO2: 91 % O2 Flow Rate (L/min): 5 L/min COVID-19 Labs  Recent Labs    08/08/20 1537 08/08/20 1612 08/09/20 0314 08/10/20 0533  FERRITIN 636*  --  834* 883*  LDH 282*  --   --   --   CRP  --  3.9* 1.8* 0.6    Lab Results  Component Value Date   SARSCOV2NAA POSITIVE (A) 08/06/2020     Class 2 obesity with body mass index (BMI) of 36.0 to 36.9 in adult -Diet and exercise.  -Encouraged to lose weight.  GERD (gastroesophageal reflux disease) Protonix continued no change.    DVT ppx: SQ Lovenox Code Status: Full code Family Communication:  Fiance Disposition Plan:  Anticipate discharge back to previous environment in 2 days . Consults called:  none Admission status: Med-surg bed as inpt     Subjective: Pt seen today he is stable clinically, vitals and labs are stable.  Pt deneis any complaints of chest pain or sob.   Pt is stable afebrile and is little anxious but NAD.  Objective: Vitals:   08/09/20 2350 08/10/20 0403 08/10/20 0727 08/10/20 1100  BP: 122/80 119/77  97/71 103/71  Pulse: 86 87 73 96  Resp: (!) 24 (!) 23 20 (!) 22  Temp: 98.2 F (36.8 C) 98.8 F (37.1 C) 99.4 F (37.4 C) 98.7 F (37.1 C)  TempSrc: Oral Oral Oral Oral  SpO2: 97% 94% 91% 91%  Weight:      Height:        Intake/Output Summary (Last 24 hours) at 08/10/2020 1315 Last data filed at 08/10/2020 0900 Gross per 24 hour  Intake 360 ml  Output --  Net 360 ml   Filed Weights   08/07/20 1917  Weight: 117.9 kg    Examination: Blood pressure 103/71, pulse 96,  temperature 98.7 F (37.1 C), temperature source Oral, resp. rate (!) 22, height 5\' 10"  (1.778 m), weight 117.9 kg, SpO2 91 %.  General exam: Appears calm and comfortable  Respiratory system: Clear to auscultation. Respiratory effort normal. Cardiovascular system: S1 & S2 heard, RRR. No JVD, murmurs, rubs, gallops or clicks. No pedal edema. Gastrointestinal system: Abdomen is nondistended, soft and nontender. No organomegaly or masses felt. Normal bowel sounds heard. Central nervous system: Alert and oriented. No focal neurological deficits. Extremities: Symmetric 5 x 5 power. Skin: No rashes, lesions or ulcers Psychiatry: Judgement and insight appear normal. Mood & affect appropriate.   Data Reviewed: I have personally reviewed following labs and imaging studies  No intake/output data recorded. Total I/O In: 360 [P.O.:360] Out: -  Lab Results  Component Value Date   CREATININE 0.89 08/10/2020   CREATININE 0.91 08/09/2020   CREATININE 0.85 08/07/2020   CBC: Recent Labs  Lab 08/06/20 1254 08/07/20 1920 08/09/20 0314 08/10/20 0533  WBC 7.4 13.3* 8.7 11.8*  NEUTROABS  --   --  7.0 9.8*  HGB 14.6 14.7 13.7 13.3  HCT 41.3 42.5 39.5 38.1*  MCV 82.1 83.8 84.8 82.5  PLT 240 340 403* 451*   Basic Metabolic Panel: Recent Labs  Lab 08/06/20 1254 08/07/20 1920 08/09/20 0314 08/10/20 0533  NA 136 139 143 145  K 3.4* 3.5 3.6 3.7  CL 101 106 108 107  CO2 24 24 24 26   GLUCOSE 112* 128* 138* 196*  BUN 11 13 17  21*  CREATININE 0.93 0.85 0.91 0.89  CALCIUM 8.4* 8.8* 8.9 8.8*   GFR: Estimated Creatinine Clearance: 153.4 mL/min (by C-G formula based on SCr of 0.89 mg/dL). Liver Function Tests: Recent Labs  Lab 08/06/20 1254 08/08/20 1537 08/09/20 0314 08/10/20 0533  AST 31 48* 70* 102*  ALT 28 44 70* 186*  ALKPHOS 60 54 60 59  BILITOT 0.6 0.5 0.7 0.9  PROT 7.7 8.0 7.8 7.1  ALBUMIN 4.3 3.9 3.8 3.6  Lipid Profile: Recent Labs    08/08/20 1537  TRIG 270*   Thyroid  Function Tests: No results for input(s): TSH, T4TOTAL, FREET4, T3FREE, THYROIDAB in the last 72 hours. Anemia Panel: Recent Labs    08/09/20 0314 08/10/20 0533  FERRITIN 834* 883*   Sepsis Labs: Recent Labs  Lab 08/08/20 1537 08/08/20 1612  PROCALCITON <0.10  --   LATICACIDVEN 2.4* 2.0*    Recent Results (from the past 240 hour(s))  SARS Coronavirus 2 by RT PCR (hospital order, performed in [facility] hospital lab) Nasopharyngeal Nasopharyngeal Swab     Status: Abnormal   Collection Time: 08/06/20 12:54 PM   Specimen: Nasopharyngeal Swab  Result Value Ref Range Status   SARS Coronavirus 2 POSITIVE (A) NEGATIVE Final    Comment: RESULT CALLED TO, READ BACK BY AND VERIFIED WITH: ANNA JASPER AT  1411 ON 08/06/2020 [facility]. (NOTE) SARS-CoV-2 target nucleic acids are DETECTED  SARS-CoV-2 RNA is generally detectable in upper respiratory specimens  during the acute phase of infection.  Positive results are indicative  of the presence of the identified virus, but do not rule out bacterial infection or co-infection with other pathogens not detected by the test.  Clinical correlation with patient history and  other diagnostic information is necessary to determine patient infection status.  The expected result is negative.  Fact Sheet for Patients:   [employer]cy   Fact Sheet for Healthcare Providers:   https://pope.com/    This test is not yet approved or cleared by the [place] FDA and  has been authorized for detection and/or diagnosis of SARS-CoV-2 by FDA under an Emergency Use Authorization (EUA).  This EUA will remain in effect (meaning this  test can be used) for the duration of  the COVID-19 declaration under Section 564(b)(1) of the Act, 21 U.S.C. section 360-bbb-3(b)(1), unless the authorization is terminated or revoked sooner.  Performed at [facility], [address] [place], [place] [address]     Culture, blood (Routine X 2) w Reflex to ID Panel     Status: None (Preliminary result)   Collection Time: 08/08/20 12:53 PM   Specimen: BLOOD  Result Value Ref Range Status   Specimen Description BLOOD L FA  Final   Special Requests   Final    BOTTLES DRAWN AEROBIC AND ANAEROBIC Blood Culture results may not be optimal due to an inadequate volume of blood received in culture bottles   Culture   Final    NO GROWTH 2 DAYS Performed at [facility], [address], [place], [place] [address]    Report Status PENDING  Incomplete  Culture, blood (Routine X 2) w Reflex to ID Panel     Status: None (Preliminary result)   Collection Time: 08/08/20  4:12 PM   Specimen: BLOOD  Result Value Ref Range Status   Specimen Description BLOOD BLOOD RIGHT HAND  Final   Special Requests   Final    BOTTLES DRAWN AEROBIC AND ANAEROBIC Blood Culture adequate volume   Culture   Final    NO GROWTH 2 DAYS Performed at [facility], [address] [place], [place] [address]    Report Status PENDING  Incomplete      Radiology Studies: No results found.  Scheduled Meds: . vitamin C  500 mg Oral Daily  . enoxaparin (LOVENOX) injection  0.5 mg/kg Subcutaneous Q24H  . ipratropium  2 puff Inhalation Q4H  . mouth rinse  15 mL Mouth Rinse BID  . methylPREDNISolone (SOLU-MEDROL) injection  60 mg Intravenous Q12H  . pantoprazole  40 mg Oral Daily  . zinc sulfate  220 mg Oral Daily   Continuous Infusions: . remdesivir 100 mg in NS 100 mL 100 mg (08/10/20 1103)     LOS: 2 days    Gertha Calkin, MD [facility] Pager [phone] If 7PM-7AM, please contact night-coverage [email or web address] Password [email or web address] 08/10/2020, 1:15 PM

## 2020-08-10 NOTE — Progress Notes (Signed)
CH visited pt. per OR for prayer; pt. lying back in bed awake w/nasal canula; says he just woke up from a three-hour nap lying prone on his stomach and he feels better and encouraged about his progress.  Pt. had been sick for two weeks prior to coming to hospital w/high fever and eventually difficulty breathing.  He is grateful to be in a room after spending a long time in ED, but is understanding of the waiting he had to do.  Pt. lives w/his fiance in [place] and she appears to be significant source of support.  Pt. spoke of a prior hospitalization at age 34 which has left him anxious about needles.  Pt. said he'd thought 'why not?' when RN asked if he wanted to see chaplain, and was curious about the role of chaplains in the hospital; no explicitly spiritual needs shared at this time, but pt. grateful for visit.  CH may follow up if possible early next week.

## 2020-08-10 NOTE — Progress Notes (Signed)
Pt proned for 3 hours early this afternoon and was able to sleep almost the whole time.

## 2020-08-11 LAB — COMPREHENSIVE METABOLIC PANEL WITH GFR
ALT: 245 U/L — ABNORMAL HIGH (ref 0–44)
AST: 80 U/L — ABNORMAL HIGH (ref 15–41)
Albumin: 3.8 g/dL (ref 3.5–5.0)
Alkaline Phosphatase: 65 U/L (ref 38–126)
Anion gap: 12 (ref 5–15)
BUN: 19 mg/dL (ref 6–20)
CO2: 25 mmol/L (ref 22–32)
Calcium: 9 mg/dL (ref 8.9–10.3)
Chloride: 106 mmol/L (ref 98–111)
Creatinine, Ser: 0.88 mg/dL (ref 0.61–1.24)
GFR calc Af Amer: >60 mL/min (ref >60)
GFR calc non Af Amer: >60 mL/min (ref >60)
Glucose, Bld: 142 mg/dL — ABNORMAL HIGH (ref 70–99)
Potassium: 3.6 mmol/L (ref 3.5–5.1)
Sodium: 143 mmol/L (ref 135–145)
Total Bilirubin: 0.9 mg/dL (ref 0.3–1.2)
Total Protein: 7.5 g/dL (ref 6.5–8.1)

## 2020-08-11 LAB — CBC WITH DIFFERENTIAL/PLATELET
Abs Immature Granulocytes: 0.53 K/uL — ABNORMAL HIGH (ref 0.00–0.07)
Basophils Absolute: 0.1 K/uL (ref 0.0–0.1)
Basophils Relative: 0 %
Eosinophils Absolute: 0 K/uL (ref 0.0–0.5)
Eosinophils Relative: 0 %
HCT: 40.6 % (ref 39.0–52.0)
Hemoglobin: 14.4 g/dL (ref 13.0–17.0)
Immature Granulocytes: 3 %
Lymphocytes Relative: 14 %
Lymphs Abs: 2.6 K/uL (ref 0.7–4.0)
MCH: 29 pg (ref 26.0–34.0)
MCHC: 35.5 g/dL (ref 30.0–36.0)
MCV: 81.7 fL (ref 80.0–100.0)
Monocytes Absolute: 0.9 K/uL (ref 0.1–1.0)
Monocytes Relative: 5 %
Neutro Abs: 14.1 K/uL — ABNORMAL HIGH (ref 1.7–7.7)
Neutrophils Relative %: 78 %
Platelets: 544 K/uL — ABNORMAL HIGH (ref 150–400)
RBC: 4.97 MIL/uL (ref 4.22–5.81)
RDW: 13.5 % (ref 11.5–15.5)
WBC: 18.3 K/uL — ABNORMAL HIGH (ref 4.0–10.5)
nRBC: 0 % (ref 0.0–0.2)

## 2020-08-11 LAB — FERRITIN: Ferritin: 805 ng/mL — ABNORMAL HIGH (ref 24–336)

## 2020-08-11 LAB — C-REACTIVE PROTEIN: CRP: 0.6 mg/dL (ref <1.0)

## 2020-08-11 LAB — FIBRIN DERIVATIVES D-DIMER (ARMC ONLY): Fibrin derivatives D-dimer (ARMC): 856.24 ng/mL (FEU) — ABNORMAL HIGH (ref 0.00–499.00)

## 2020-08-12 LAB — CBC WITH DIFFERENTIAL/PLATELET
Abs Immature Granulocytes: 1.02 10*3/uL — ABNORMAL HIGH (ref 0.00–0.07)
Basophils Absolute: 0.1 10*3/uL (ref 0.0–0.1)
Basophils Relative: 0 %
Eosinophils Absolute: 0 10*3/uL (ref 0.0–0.5)
Eosinophils Relative: 0 %
HCT: 41.6 % (ref 39.0–52.0)
Hemoglobin: 14.5 g/dL (ref 13.0–17.0)
Immature Granulocytes: 5 %
Lymphocytes Relative: 13 %
Lymphs Abs: 2.6 10*3/uL (ref 0.7–4.0)
MCH: 29.4 pg (ref 26.0–34.0)
MCHC: 34.9 g/dL (ref 30.0–36.0)
MCV: 84.2 fL (ref 80.0–100.0)
Monocytes Absolute: 1 10*3/uL (ref 0.1–1.0)
Monocytes Relative: 5 %
Neutro Abs: 15.7 10*3/uL — ABNORMAL HIGH (ref 1.7–7.7)
Neutrophils Relative %: 77 %
Platelets: 576 10*3/uL — ABNORMAL HIGH (ref 150–400)
RBC: 4.94 MIL/uL (ref 4.22–5.81)
RDW: 13.4 % (ref 11.5–15.5)
WBC: 20.3 10*3/uL — ABNORMAL HIGH (ref 4.0–10.5)
nRBC: 0 % (ref 0.0–0.2)

## 2020-08-12 LAB — COMPREHENSIVE METABOLIC PANEL
ALT: 205 U/L — ABNORMAL HIGH (ref 0–44)
AST: 45 U/L — ABNORMAL HIGH (ref 15–41)
Albumin: 3.6 g/dL (ref 3.5–5.0)
Alkaline Phosphatase: 63 U/L (ref 38–126)
Anion gap: 12 (ref 5–15)
BUN: 24 mg/dL — ABNORMAL HIGH (ref 6–20)
CO2: 26 mmol/L (ref 22–32)
Calcium: 9.1 mg/dL (ref 8.9–10.3)
Chloride: 106 mmol/L (ref 98–111)
Creatinine, Ser: 0.97 mg/dL (ref 0.61–1.24)
GFR calc Af Amer: >60 mL/min (ref >60)
GFR calc non Af Amer: >60 mL/min (ref >60)
Glucose, Bld: 139 mg/dL — ABNORMAL HIGH (ref 70–99)
Potassium: 4.3 mmol/L (ref 3.5–5.1)
Sodium: 144 mmol/L (ref 135–145)
Total Bilirubin: 0.7 mg/dL (ref 0.3–1.2)
Total Protein: 7.2 g/dL (ref 6.5–8.1)

## 2020-08-12 LAB — FERRITIN: Ferritin: 847 ng/mL — ABNORMAL HIGH (ref 24–336)

## 2020-08-12 LAB — FIBRIN DERIVATIVES D-DIMER (ARMC ONLY): Fibrin derivatives D-dimer (ARMC): 660.74 ng/mL (FEU) — ABNORMAL HIGH (ref 0.00–499.00)

## 2020-08-12 LAB — PROCALCITONIN: Procalcitonin: <0.1 ng/mL

## 2020-08-12 LAB — C-REACTIVE PROTEIN: CRP: 0.5 mg/dL (ref <1.0)

## 2020-08-12 MED ORDER — DEXAMETHASONE 0.5 MG PO TABS: 0.5000 mg | ORAL_TABLET | Freq: Two times a day (BID) | ORAL | 0 refills | Status: AC

## 2020-08-12 MED ORDER — ZINC SULFATE 220 (50 ZN) MG PO CAPS: 220.0000 mg | ORAL_CAPSULE | Freq: Every day | ORAL | 0 refills | Status: AC

## 2020-08-12 MED ORDER — ASCORBIC ACID 500 MG PO TABS: 500.0000 mg | ORAL_TABLET | Freq: Every day | ORAL | 0 refills | Status: AC

## 2020-08-12 NOTE — Discharge Summary (Signed)
Physician Discharge Summary  Derek Wheeler. TGG:[id number] DOB: 1986-11-05 DOA: 08/08/2020  PCP: Derek Loveless, PA-C  Admit date: 08/08/2020 Discharge date: 08/12/2020   Discharge Diagnoses:  Principal Problem:   Pneumonia due to COVID-19 virus Active Problems:   Class 2 obesity with body mass index (BMI) of 36.0 to 36.9 in adult   GERD (gastroesophageal reflux disease)   Acute respiratory failure with hypoxia (HCC) Pneumonia due to COVID-19 virusand acute respiratory failure with hypoxia:Oxygen desaturation to 87% on room air,which improved to 94% on 2 L nasal cannula oxygen.Chest x-ray showed bilateral patchy infiltration. Patient was not Covid vaccinated.  -will admit to med-surg bed as inpt -Remdesivir per pharm -Solumedrol 60 mg bid -vitamin C, zinc.  -Bronchodilators -PRN Mucinex for cough -f/u Blood culture -D-dimer, BNP,Trop, LFT, CRP, LDH, Procalcitonin, Ferritin, fibinogen, TG, Hep B SAg, HIV ab -Daily CRP, Ferritin, D-dimer, -Will ask the patient to maintain an awake prone position for 16+ hours a day, if possible, with a minimum of 2-3 hours at a time -Will attempt to maintain euvolemia to a net negative fluid status -IF patient deteriorates, will consult PCCM and ID. -resolved.  -pt is clinically doing well , he is alert,awake and feels better. Denies any cough or chest pain or sob and ros is oterhwise negative. Plan is for d/c today .  Class 2 obesity with body mass index (BMI) of 36.0 to 36.9 in adult -Diet and exercise. -Encouraged to lose weight.  GERD (gastroesophageal reflux disease) -protonix -otc prn ppi if needed.   Discharge Condition:  Good.  Diet recommendation:  Heart healthy.  Filed Weights   08/07/20 1917  Weight: 117.9 kg    Discharge activity: Advance as tolerated.   History of present illness:  Derek Wheeleris a 33 y.o.malewith medical history significant ofhyperlipidemia, GERD, dysplastic nevus, obesity,  who presents with shortness of breath.  Patient states that he has been having shortness breath foralmost a week, whichhas been progressively worsening. Patient has a dry cough, no chest pain. Patient had fever and chills in the 1st several days, which has resolved. Currently no fever or chills. Patient does not have nausea vomiting, diarrhea or abdominal pain. No symptoms of UTI or unilateral weakness. Patient was seen in the ED on 9/6 and was diagnosed with COVID-19 infection. Patient was started with prednisone tapering, but short of breath has been continuously worsening. Oxygen desaturation to 87% on room air,which improved to 94% on 2 L nasal cannula oxygen.  ED Course:pt was found to have WBC 13.3, troponin9, 8, electrolytes renal function okay, temperature normal, blood pressure 116/75, tachycardia with heart rate 111-->100, tachypnea with RR 22,chest x-ray showed bilateral patchy infiltration.Patient is admitted to MedSurg bed as inpatient.  Hospital Course:  Pt admitted with covid respiratory failure requiring oxygen and pt started on remdesivir, Solu-Medrol, vitamin C and zinc, as needed Mucinex bronchodilator therapy as needed. Patient recovered well from his initial presentation with hypoxia of 87% on initial presentation.  Pt has recovered well and is currently off oxygen and ambulating and no need for supplemental oxygen .  Procedures: None  Consultations: None  Discharge Exam: Vitals:   08/12/20 1140 08/12/20 1157  BP:  132/78  Pulse:    Resp:  15  Temp:  98.5 F (36.9 C)  SpO2: 93%   Physical Exam Vitals and nursing note reviewed.  Constitutional:      Appearance: He is obese.  HENT:     Head: Normocephalic and atraumatic.  Mouth/Throat:     Mouth: Mucous membranes are moist.     Pharynx: Oropharynx is clear.  Eyes:     Extraocular Movements: Extraocular movements intact.  Cardiovascular:     Rate and Rhythm: Normal rate and regular rhythm.   Pulmonary:     Effort: Pulmonary effort is normal.     Breath sounds: Normal breath sounds.  Abdominal:     Palpations: Abdomen is soft.  Musculoskeletal:     Cervical back: Normal range of motion.  Skin:    General: Skin is warm.  Neurological:     General: No focal deficit present.     Mental Status: He is alert and oriented to person, place, and time.     Discharge Instructions   Discharge Instructions    Call MD for:  difficulty breathing, headache or visual disturbances   Complete by: As directed    Call MD for:  extreme fatigue   Complete by: As directed    Call MD for:  hives   Complete by: As directed    Call MD for:  persistant dizziness or light-headedness   Complete by: As directed    Call MD for:  persistant nausea and vomiting   Complete by: As directed    Call MD for:  severe uncontrolled pain   Complete by: As directed    Call MD for:  temperature >100.4   Complete by: As directed    Diet - low sodium heart healthy   Complete by: As directed    Increase activity slowly   Complete by: As directed      Allergies as of 08/12/2020   No Known Allergies     Medication List    STOP taking these medications   hydrocortisone 2.5 % lotion   predniSONE 10 MG tablet Commonly known as: DELTASONE     TAKE these medications   ascorbic acid 500 MG tablet Commonly known as: VITAMIN C Take 1 tablet (500 mg total) by mouth daily for 7 days. Start taking on: August 13, 2020   dexamethasone 0.5 MG tablet Commonly known as: Decadron Take 1 tablet (0.5 mg total) by mouth 2 (two) times daily for 7 days.   guaiFENesin-codeine 100-10 MG/5ML syrup Take 5 mLs by mouth every 6 (six) hours as needed for cough.   omeprazole 20 MG capsule Commonly known as: PRILOSEC Take 1 capsule (20 mg total) by mouth daily.   ondansetron 4 MG tablet Commonly known as: Zofran Take 1 tablet (4 mg total) by mouth daily as needed for nausea or vomiting.   zinc sulfate 220  (50 Zn) MG capsule Take 1 capsule (220 mg total) by mouth daily. Start taking on: August 13, 2020      No Known Allergies  Follow-up Information    Derek Loveless, PA-C Follow up in 1 week(s).   Specialty: Family Medicine Contact information: [address] [place] [address] [phone]                The results of significant diagnostics from this hospitalization (including imaging, microbiology, ancillary and laboratory) are listed below for reference.    Significant Diagnostic Studies: DG Chest 2 View  Result Date: 08/08/2020 CLINICAL DATA:  Cough, COVID, shortness of breath EXAM: CHEST - 2 VIEW COMPARISON:  08/06/2020 FINDINGS: Patchy bilateral airspace opacities, increasing since prior study. Heart is normal size. Low lung volumes. No effusions or pneumothorax. No acute bony abnormality. IMPRESSION: Patchy bilateral airspace disease compatible with COVID  pneumonia, worsening since prior study. Electronically Signed   By: Charlett Nose M.D.   On: 08/08/2020 00:14   DG Chest 2 View  Result Date: 08/06/2020 CLINICAL DATA:  Cough, fever.  COVID positive. EXAM: CHEST - 2 VIEW COMPARISON:  None. FINDINGS: Heart size is within normal limits. Streaky airspace opacities bilaterally, mid and lower lobe predominant. No pleural effusion or pneumothorax is seen. Osseous structures about the chest are unremarkable. IMPRESSION: Bilateral multifocal pneumonia, mid and lower lobe predominant. Electronically Signed   By: Bary Richard M.D.   On: 08/06/2020 10:53    Microbiology: Recent Results (from the past 240 hour(s))  SARS Coronavirus 2 by RT PCR (hospital order, performed in [facility] hospital lab) Nasopharyngeal Nasopharyngeal Swab     Status: Abnormal   Collection Time: 08/06/20 12:54 PM   Specimen: Nasopharyngeal Swab  Result Value Ref Range Status   SARS Coronavirus 2 POSITIVE (A) NEGATIVE Final    Comment: RESULT CALLED TO, READ BACK BY AND VERIFIED  WITH: ANNA JASPER AT 1411 ON 08/06/2020 [facility]. (NOTE) SARS-CoV-2 target nucleic acids are DETECTED  SARS-CoV-2 RNA is generally detectable in upper respiratory specimens  during the acute phase of infection.  Positive results are indicative  of the presence of the identified virus, but do not rule out bacterial infection or co-infection with other pathogens not detected by the test.  Clinical correlation with patient history and  other diagnostic information is necessary to determine patient infection status.  The expected result is negative.  Fact Sheet for Patients:   BoilerBrush.com.cy   Fact Sheet for Healthcare Providers:   https://pope.com/    This test is not yet approved or cleared by the [place] FDA and  has been authorized for detection and/or diagnosis of SARS-CoV-2 by FDA under an Emergency Use Authorization (EUA).  This EUA will remain in effect (meaning this  test can be used) for the duration of  the COVID-19 declaration under Section 564(b)(1) of the Act, 21 U.S.C. section 360-bbb-3(b)(1), unless the authorization is terminated or revoked sooner.  Performed at [facility], [address] [place], [place] [address]   Culture, blood (Routine X 2) w Reflex to ID Panel     Status: None (Preliminary result)   Collection Time: 08/08/20 12:53 PM   Specimen: BLOOD  Result Value Ref Range Status   Specimen Description BLOOD L FA  Final   Special Requests   Final    BOTTLES DRAWN AEROBIC AND ANAEROBIC Blood Culture results may not be optimal due to an inadequate volume of blood received in culture bottles   Culture   Final    NO GROWTH 4 DAYS Performed at [facility], [address], [place], [place] [address]    Report Status PENDING  Incomplete  Culture, blood (Routine X 2) w Reflex to ID Panel     Status: None (Preliminary result)   Collection Time: 08/08/20  4:12 PM   Specimen: BLOOD  Result  Value Ref Range Status   Specimen Description BLOOD BLOOD RIGHT HAND  Final   Special Requests   Final    BOTTLES DRAWN AEROBIC AND ANAEROBIC Blood Culture adequate volume   Culture   Final    NO GROWTH 4 DAYS Performed at [facility], [address] [place], [place] [address]    Report Status PENDING  Incomplete     Labs: Basic Metabolic Panel: Recent Labs  Lab 08/07/20 1920 08/09/20 3536 08/10/20 0533 08/11/20 1443 08/12/20 1540  NA 139 143 145 143 144  K 3.5 3.6 3.7 3.6 4.3  CL 106 108 107 106 106  CO2 24 24 26 25 26   GLUCOSE 128* 138* 196* 142* 139*  BUN 13 17 21* 19 24*  CREATININE 0.85 0.91 0.89 0.88 0.97  CALCIUM 8.8* 8.9 8.8* 9.0 9.1   Liver Function Tests: Recent Labs  Lab 08/08/20 1537 08/09/20 0314 08/10/20 0533 08/11/20 0642 08/12/20 0609  AST 48* 70* 102* 80* 45*  ALT 44 70* 186* 245* 205*  ALKPHOS 54 60 59 65 63  BILITOT 0.5 0.7 0.9 0.9 0.7  PROT 8.0 7.8 7.1 7.5 7.2  ALBUMIN 3.9 3.8 3.6 3.8 3.6   No results for input(s): LIPASE, AMYLASE in the last 168 hours. No results for input(s): AMMONIA in the last 168 hours. CBC: Recent Labs  Lab 08/07/20 1920 08/09/20 0314 08/10/20 0533 08/11/20 0642 08/12/20 0609  WBC 13.3* 8.7 11.8* 18.3* 20.3*  NEUTROABS  --  7.0 9.8* 14.1* 15.7*  HGB 14.7 13.7 13.3 14.4 14.5  HCT 42.5 39.5 38.1* 40.6 41.6  MCV 83.8 84.8 82.5 81.7 84.2  PLT 340 403* 451* 544* 576*    BNP (last 3 results) Recent Labs    08/08/20 1535  BNP 25.8    Time spent: 20 minutes  Signed:  Gertha CalkinEkta V Waylon Hershey MD.  [facility] 08/12/2020, 3:14 PM

## 2020-08-12 NOTE — Progress Notes (Signed)
Received Md order to discharge patient to home, reviwed discharge instructions, follow up appoints, home meds and prescriptions with patient and patient verbalized understanding

## 2020-08-12 NOTE — Discharge Instructions (Signed)
10 Things You Can Do to Manage Your COVID-19 Symptoms at Home If you have possible or confirmed COVID-19: 1. Stay home from work and school. And stay away from other public places. If you must go out, avoid using any kind of public transportation, ridesharing, or taxis. 2. Monitor your symptoms carefully. If your symptoms get worse, call your healthcare provider immediately. 3. Get rest and stay hydrated. 4. If you have a medical appointment, call the healthcare provider ahead of time and tell them that you have or may have COVID-19. 5. For medical emergencies, call 911 and notify the dispatch personnel that you have or may have COVID-19. 6. Cover your cough and sneezes with a tissue or use the inside of your elbow. 7. Wash your hands often with soap and water for at least 20 seconds or clean your hands with an alcohol-based hand sanitizer that contains at least 60% alcohol. 8. As much as possible, stay in a specific room and away from other people in your home. Also, you should use a separate bathroom, if available. If you need to be around other people in or outside of the home, wear a mask. 9. Avoid sharing personal items with other people in your household, like dishes, towels, and bedding. 10. Clean all surfaces that are touched often, like counters, tabletops, and doorknobs. Use household cleaning sprays or wipes according to the label instructions. cdc.gov/coronavirus 06/01/2019 This information is not intended to replace advice given to you by your health care provider. Make sure you discuss any questions you have with your health care provider. Document Revised: 11/03/2019 Document Reviewed: 11/03/2019 Elsevier Patient Education  2020 Elsevier Inc.  COVID-19: How to Protect Yourself and Others Know how it spreads  There is currently no vaccine to prevent coronavirus disease 2019 (COVID-19).  The best way to prevent illness is to avoid being exposed to this virus.  The virus is  thought to spread mainly from person-to-person. ? Between people who are in close contact with one another (within about 6 feet). ? Through respiratory droplets produced when an infected person coughs, sneezes or talks. ? These droplets can land in the mouths or noses of people who are nearby or possibly be inhaled into the lungs. ? COVID-19 may be spread by people who are not showing symptoms. Everyone should Clean your hands often  Wash your hands often with soap and water for at least 20 seconds especially after you have been in a public place, or after blowing your nose, coughing, or sneezing.  If soap and water are not readily available, use a hand sanitizer that contains at least 60% alcohol. Cover all surfaces of your hands and rub them together until they feel dry.  Avoid touching your eyes, nose, and mouth with unwashed hands. Avoid close contact  Limit contact with others as much as possible.  Avoid close contact with people who are sick.  Put distance between yourself and other people. ? Remember that some people without symptoms may be able to spread virus. ? This is especially important for people who are at higher risk of getting very sick.www.cdc.gov/coronavirus/2019-ncov/need-extra-precautions/people-at-higher-risk.html Cover your mouth and nose with a mask when around others  You could spread COVID-19 to others even if you do not feel sick.  Everyone should wear a mask in public settings and when around people not living in their household, especially when social distancing is difficult to maintain. ? Masks should not be placed on young children under age 2, anyone who   has trouble breathing, or is unconscious, incapacitated or otherwise unable to remove the mask without assistance.  The mask is meant to protect other people in case you are infected.  Do NOT use a facemask meant for a Dietitian.  Continue to keep about 6 feet between yourself and others. The  mask is not a substitute for social distancing. Cover coughs and sneezes  Always cover your mouth and nose with a tissue when you cough or sneeze or use the inside of your elbow.  Throw used tissues in the trash.  Immediately wash your hands with soap and water for at least 20 seconds. If soap and water are not readily available, clean your hands with a hand sanitizer that contains at least 60% alcohol. Clean and disinfect  Clean AND disinfect frequently touched surfaces daily. This includes tables, doorknobs, light switches, countertops, handles, desks, phones, keyboards, toilets, faucets, and sinks. RackRewards.fr  If surfaces are dirty, clean them: Use detergent or soap and water prior to disinfection.  Then, use a household disinfectant. You can see a list of EPA-registered household disinfectants here. [email or web address].com 08/03/2019 This information is not intended to replace advice given to you by your health care provider. Make sure you discuss any questions you have with your health care provider. Document Revised: 08/11/2019 Document Reviewed: 06/09/2019 Elsevier Patient Education  [place].   COVID-19 Frequently Asked Questions COVID-19 (coronavirus disease) is an infection that is caused by a large family of viruses. Some viruses cause illness in people and others cause illness in animals like camels, cats, and bats. In some cases, the viruses that cause illness in animals can spread to humans. Where did the coronavirus come from? In December 2019, [place] told the [facility]) of several cases of lung disease (human respiratory illness). These cases were linked to an open seafood and livestock market in the city of [place]. The link to the seafood and livestock market suggests that the virus may have spread from animals to humans. However, since that first outbreak in December, the virus  has also been shown to spread from person to person. What is the name of the disease and the virus? Disease name Early on, this disease was called novel coronavirus. This is because scientists determined that the disease was caused by a new (novel) respiratory virus. The World Health Organization [facility]) has now named the disease COVID-19, or coronavirus disease. Virus name The virus that causes the disease is called severe acute respiratory syndrome coronavirus 2 (SARS-CoV-2). More information on disease and virus naming World Health Organization [facility]): www.who.int/emergencies/diseases/novel-coronavirus-2019/technical-guidance/naming-the-coronavirus-disease-(covid-2019)-and-the-virus-that-causes-it Who is at risk for complications from coronavirus disease? Some people may be at higher risk for complications from coronavirus disease. This includes older adults and people who have chronic diseases, such as heart disease, diabetes, and lung disease. If you are at higher risk for complications, take these extra precautions:  Stay home as much as possible.  Avoid social gatherings and travel.  Avoid close contact with others. Stay at least 6 ft (2 m) away from others, if possible.  Wash your hands often with soap and water for at least 20 seconds.  Avoid touching your face, mouth, nose, or eyes.  Keep supplies on hand at home, such as food, medicine, and cleaning supplies.  If you must go out in public, wear a cloth face covering or face mask. Make sure your mask covers your nose and mouth. How does coronavirus disease spread? The virus that causes coronavirus disease spreads  easily from person to person (is contagious). You may catch the virus by:  Breathing in droplets from an infected person. Droplets can be spread by a person breathing, speaking, singing, coughing, or sneezing.  Touching something, like a table or a doorknob, that was exposed to the virus (contaminated) and then touching  your mouth, nose, or eyes. Can I get the virus from touching surfaces or objects? There is still a lot that we do not know about the virus that causes coronavirus disease. Scientists are basing a lot of information on what they know about similar viruses, such as:  Viruses cannot generally survive on surfaces for long. They need a human body (host) to survive.  It is more likely that the virus is spread by close contact with people who are sick (direct contact), such as through: ? Shaking hands or hugging. ? Breathing in respiratory droplets that travel through the air. Droplets can be spread by a person breathing, speaking, singing, coughing, or sneezing.  It is less likely that the virus is spread when a person touches a surface or object that has the virus on it (indirect contact). The virus may be able to enter the body if the person touches a surface or object and then touches his or her face, eyes, nose, or mouth. Can a person spread the virus without having symptoms of the disease? It may be possible for the virus to spread before a person has symptoms of the disease, but this is most likely not the main way the virus is spreading. It is more likely for the virus to spread by being in close contact with people who are sick and breathing in the respiratory droplets spread by a person breathing, speaking, singing, coughing, or sneezing. What are the symptoms of coronavirus disease? Symptoms vary from person to person and can range from mild to severe. Symptoms may include:  Fever or chills.  Cough.  Difficulty breathing or feeling short of breath.  Headaches, body aches, or muscle aches.  Runny or stuffy (congested) nose.  Sore throat.  New loss of taste or smell.  Nausea, vomiting, or diarrhea. These symptoms can appear anywhere from 2 to 14 days after you have been exposed to the virus. Some people may not have any symptoms. If you develop symptoms, call your health care  provider. People with severe symptoms may need hospital care. Should I be tested for this virus? Your health care provider will decide whether to test you based on your symptoms, history of exposure, and your risk factors. How does a health care provider test for this virus? Health care providers will collect samples to send for testing. Samples may include:  Taking a swab of fluid from the back of your nose and throat, your nose, or your throat.  Taking fluid from the lungs by having you cough up mucus (sputum) into a sterile cup.  Taking a blood sample. Is there a treatment or vaccine for this virus? Currently, there is no vaccine to prevent coronavirus disease. Also, there are no medicines like antibiotics or antivirals to treat the virus. A person who becomes sick is given supportive care, which means rest and fluids. A person may also relieve his or her symptoms by using over-the-counter medicines that treat sneezing, coughing, and runny nose. These are the same medicines that a person takes for the common cold. If you develop symptoms, call your health care provider. People with severe symptoms may need hospital care. What can I do  to protect myself and my family from this virus?     You can protect yourself and your family by taking the same actions that you would take to prevent the spread of other viruses. Take the following actions:  Wash your hands often with soap and water for at least 20 seconds. If soap and water are not available, use alcohol-based hand sanitizer.  Avoid touching your face, mouth, nose, or eyes.  Cough or sneeze into a tissue, sleeve, or elbow. Do not cough or sneeze into your hand or the air. ? If you cough or sneeze into a tissue, throw it away immediately and wash your hands.  Disinfect objects and surfaces that you frequently touch every day.  Stay away from people who are sick.  Avoid going out in public, follow guidance from your state and local  health authorities.  Avoid crowded indoor spaces. Stay at least 6 ft (2 m) away from others.  If you must go out in public, wear a cloth face covering or face mask. Make sure your mask covers your nose and mouth.  Stay home if you are sick, except to get medical care. Call your health care provider before you get medical care. Your health care provider will tell you how long to stay home.  Make sure your vaccines are up to date. Ask your health care provider what vaccines you need. What should I do if I need to travel? Follow travel recommendations from your local health authority, the CDC, and WHO. Travel information and advice  Centers for Disease Control and Prevention (CDC): BodyEditor.hu  World Health Organization [facility]): ThirdIncome.ca Know the risks and take action to protect your health  You are at higher risk of getting coronavirus disease if you are traveling to areas with an outbreak or if you are exposed to travelers from areas with an outbreak.  Wash your hands often and practice good hygiene to lower the risk of catching or spreading the virus. What should I do if I am sick? General instructions to stop the spread of infection  Wash your hands often with soap and water for at least 20 seconds. If soap and water are not available, use alcohol-based hand sanitizer.  Cough or sneeze into a tissue, sleeve, or elbow. Do not cough or sneeze into your hand or the air.  If you cough or sneeze into a tissue, throw it away immediately and wash your hands.  Stay home unless you must get medical care. Call your health care provider or local health authority before you get medical care.  Avoid public areas. Do not take public transportation, if possible.  If you can, wear a mask if you must go out of the house or if you are in close contact with someone who is not sick. Make sure your  mask covers your nose and mouth. Keep your home clean  Disinfect objects and surfaces that are frequently touched every day. This may include: ? Counters and tables. ? Doorknobs and light switches. ? Sinks and faucets. ? Electronics such as phones, remote controls, keyboards, computers, and tablets.  Wash dishes in hot, soapy water or use a dishwasher. Air-dry your dishes.  Wash laundry in hot water. Prevent infecting other household members  Let healthy household members care for children and pets, if possible. If you have to care for children or pets, wash your hands often and wear a mask.  Sleep in a different bedroom or bed, if possible.  Do not share  personal items, such as razors, toothbrushes, deodorant, combs, brushes, towels, and washcloths. Where to find more information Centers for Disease Control and Prevention (CDC)  Information and news updates: CardRetirement.cz World Health Organization [facility])  Information and news updates: AffordableSalon.es  Coronavirus health topic: https://thompson-craig.com/  Questions and answers on COVID-19: kruiseway.com  Global tracker: who.sprinklr.com American Academy of Pediatrics (AAP)  Information for families: www.healthychildren.org/English/health-issues/conditions/chest-lungs/Pages/2019-Novel-Coronavirus.aspx The coronavirus situation is changing rapidly. Check your local health authority website or the CDC and [facility] websites for updates and news. When should I contact a health care provider?  Contact your health care provider if you have symptoms of an infection, such as fever or cough, and you: ? Have been near anyone who is known to have coronavirus disease. ? Have come into contact with a person who is suspected to have coronavirus disease. ? Have traveled to an area where there is an outbreak of COVID-19. When should I get  emergency medical care?  Get help right away by calling your local emergency services (911 in the [place] if you have: ? Trouble breathing. ? Pain or pressure in your chest. ? Confusion. ? Blue-tinged lips and fingernails. ? Difficulty waking from sleep. ? Symptoms that get worse. Let the emergency medical personnel know if you think you have coronavirus disease. Summary  A new respiratory virus is spreading from person to person and causing COVID-19 (coronavirus disease).  The virus that causes COVID-19 appears to spread easily. It spreads from one person to another through droplets from breathing, speaking, singing, coughing, or sneezing.  Older adults and those with chronic diseases are at higher risk of disease. If you are at higher risk for complications, take extra precautions.  There is currently no vaccine to prevent coronavirus disease. There are no medicines, such as antibiotics or antivirals, to treat the virus.  You can protect yourself and your family by washing your hands often, avoiding touching your face, and covering your coughs and sneezes. This information is not intended to replace advice given to you by your health care provider. Make sure you discuss any questions you have with your health care provider. Document Revised: 09/16/2019 Document Reviewed: 03/15/2019 Elsevier Patient Education  2020 Elsevier Inc.   COVID-19 COVID-19 is a respiratory infection that is caused by a virus called severe acute respiratory syndrome coronavirus 2 (SARS-CoV-2). The disease is also known as coronavirus disease or novel coronavirus. In some people, the virus may not cause any symptoms. In others, it may cause a serious infection. The infection can get worse quickly and can lead to complications, such as:  Pneumonia, or infection of the lungs.  Acute respiratory distress syndrome or ARDS. This is a condition in which fluid build-up in the lungs prevents the lungs from filling with  air and passing oxygen into the blood.  Acute respiratory failure. This is a condition in which there is not enough oxygen passing from the lungs to the body or when carbon dioxide is not passing from the lungs out of the body.  Sepsis or septic shock. This is a serious bodily reaction to an infection.  Blood clotting problems.  Secondary infections due to bacteria or fungus.  Organ failure. This is when your body's organs stop working. The virus that causes COVID-19 is contagious. This means that it can spread from person to person through droplets from coughs and sneezes (respiratory secretions). What are the causes? This illness is caused by a virus. You may catch the virus by:  Breathing in droplets  from an infected person. Droplets can be spread by a person breathing, speaking, singing, coughing, or sneezing.  Touching something, like a table or a doorknob, that was exposed to the virus (contaminated) and then touching your mouth, nose, or eyes. What increases the risk? Risk for infection You are more likely to be infected with this virus if you:  Are within 6 feet (2 meters) of a person with COVID-19.  Provide care for or live with a person who is infected with COVID-19.  Spend time in crowded indoor spaces or live in shared housing. Risk for serious illness You are more likely to become seriously ill from the virus if you:  Are 64 years of age or older. The higher your age, the more you are at risk for serious illness.  Live in a nursing home or long-term care facility.  Have cancer.  Have a long-term (chronic) disease such as: ? Chronic lung disease, including chronic obstructive pulmonary disease or asthma. ? A long-term disease that lowers your body's ability to fight infection (immunocompromised). ? Heart disease, including heart failure, a condition in which the arteries that lead to the heart become narrow or blocked (coronary artery disease), a disease which makes  the heart muscle thick, weak, or stiff (cardiomyopathy). ? Diabetes. ? Chronic kidney disease. ? Sickle cell disease, a condition in which red blood cells have an abnormal "sickle" shape. ? Liver disease.  Are obese. What are the signs or symptoms? Symptoms of this condition can range from mild to severe. Symptoms may appear any time from 2 to 14 days after being exposed to the virus. They include:  A fever or chills.  A cough.  Difficulty breathing.  Headaches, body aches, or muscle aches.  Runny or stuffy (congested) nose.  A sore throat.  New loss of taste or smell. Some people may also have stomach problems, such as nausea, vomiting, or diarrhea. Other people may not have any symptoms of COVID-19. How is this diagnosed? This condition may be diagnosed based on:  Your signs and symptoms, especially if: ? You live in an area with a COVID-19 outbreak. ? You recently traveled to or from an area where the virus is common. ? You provide care for or live with a person who was diagnosed with COVID-19. ? You were exposed to a person who was diagnosed with COVID-19.  A physical exam.  Lab tests, which may include: ? Taking a sample of fluid from the back of your nose and throat (nasopharyngeal fluid), your nose, or your throat using a swab. ? A sample of mucus from your lungs (sputum). ? Blood tests.  Imaging tests, which may include, X-rays, CT scan, or ultrasound. How is this treated? At present, there is no medicine to treat COVID-19. Medicines that treat other diseases are being used on a trial basis to see if they are effective against COVID-19. Your health care provider will talk with you about ways to treat your symptoms. For most people, the infection is mild and can be managed at home with rest, fluids, and over-the-counter medicines. Treatment for a serious infection usually takes places in a hospital intensive care unit (ICU). It may include one or more of the  following treatments. These treatments are given until your symptoms improve.  Receiving fluids and medicines through an IV.  Supplemental oxygen. Extra oxygen is given through a tube in the nose, a face mask, or a hood.  Positioning you to lie on your stomach (prone position).  This makes it easier for oxygen to get into the lungs.  Continuous positive airway pressure (CPAP) or bi-level positive airway pressure (BPAP) machine. This treatment uses mild air pressure to keep the airways open. A tube that is connected to a motor delivers oxygen to the body.  Ventilator. This treatment moves air into and out of the lungs by using a tube that is placed in your windpipe.  Tracheostomy. This is a procedure to create a hole in the neck so that a breathing tube can be inserted.  Extracorporeal membrane oxygenation (ECMO). This procedure gives the lungs a chance to recover by taking over the functions of the heart and lungs. It supplies oxygen to the body and removes carbon dioxide. Follow these instructions at home: Lifestyle  If you are sick, stay home except to get medical care. Your health care provider will tell you how long to stay home. Call your health care provider before you go for medical care.  Rest at home as told by your health care provider.  Do not use any products that contain nicotine or tobacco, such as cigarettes, e-cigarettes, and chewing tobacco. If you need help quitting, ask your health care provider.  Return to your normal activities as told by your health care provider. Ask your health care provider what activities are safe for you. General instructions  Take over-the-counter and prescription medicines only as told by your health care provider.  Drink enough fluid to keep your urine pale yellow.  Keep all follow-up visits as told by your health care provider. This is important. How is this prevented?  There is no vaccine to help prevent COVID-19 infection. However,  there are steps you can take to protect yourself and others from this virus. To protect yourself:   Do not travel to areas where COVID-19 is a risk. The areas where COVID-19 is reported change often. To identify high-risk areas and travel restrictions, check the CDC travel website: FatFares.com.br  If you live in, or must travel to, an area where COVID-19 is a risk, take precautions to avoid infection. ? Stay away from people who are sick. ? Wash your hands often with soap and water for 20 seconds. If soap and water are not available, use an alcohol-based hand sanitizer. ? Avoid touching your mouth, face, eyes, or nose. ? Avoid going out in public, follow guidance from your state and local health authorities. ? If you must go out in public, wear a cloth face covering or face mask. Make sure your mask covers your nose and mouth. ? Avoid crowded indoor spaces. Stay at least 6 feet (2 meters) away from others. ? Disinfect objects and surfaces that are frequently touched every day. This may include:  Counters and tables.  Doorknobs and light switches.  Sinks and faucets.  Electronics, such as phones, remote controls, keyboards, computers, and tablets. To protect others: If you have symptoms of COVID-19, take steps to prevent the virus from spreading to others.  If you think you have a COVID-19 infection, contact your health care provider right away. Tell your health care team that you think you may have a COVID-19 infection.  Stay home. Leave your house only to seek medical care. Do not use public transport.  Do not travel while you are sick.  Wash your hands often with soap and water for 20 seconds. If soap and water are not available, use alcohol-based hand sanitizer.  Stay away from other members of your household. Let healthy household members  care for children and pets, if possible. If you have to care for children or pets, wash your hands often and wear a mask. If  possible, stay in your own room, separate from others. Use a different bathroom.  Make sure that all people in your household wash their hands well and often.  Cough or sneeze into a tissue or your sleeve or elbow. Do not cough or sneeze into your hand or into the air.  Wear a cloth face covering or face mask. Make sure your mask covers your nose and mouth. Where to find more information  Centers for Disease Control and Prevention: StickerEmporium.tn  World Health Organization: https://thompson-craig.com/ Contact a health care provider if:  You live in or have traveled to an area where COVID-19 is a risk and you have symptoms of the infection.  You have had contact with someone who has COVID-19 and you have symptoms of the infection. Get help right away if:  You have trouble breathing.  You have pain or pressure in your chest.  You have confusion.  You have bluish lips and fingernails.  You have difficulty waking from sleep.  You have symptoms that get worse. These symptoms may represent a serious problem that is an emergency. Do not wait to see if the symptoms will go away. Get medical help right away. Call your local emergency services (911 in the [place] Do not drive yourself to the hospital. Let the emergency medical personnel know if you think you have COVID-19. Summary  COVID-19 is a respiratory infection that is caused by a virus. It is also known as coronavirus disease or novel coronavirus. It can cause serious infections, such as pneumonia, acute respiratory distress syndrome, acute respiratory failure, or sepsis.  The virus that causes COVID-19 is contagious. This means that it can spread from person to person through droplets from breathing, speaking, singing, coughing, or sneezing.  You are more likely to develop a serious illness if you are 74 years of age or older, have a weak immune system, live in a nursing home, or have chronic  disease.  There is no medicine to treat COVID-19. Your health care provider will talk with you about ways to treat your symptoms.  Take steps to protect yourself and others from infection. Wash your hands often and disinfect objects and surfaces that are frequently touched every day. Stay away from people who are sick and wear a mask if you are sick. This information is not intended to replace advice given to you by your health care provider. Make sure you discuss any questions you have with your health care provider. Document Revised: 09/16/2019 Document Reviewed: 12/23/2018 Elsevier Patient Education  2020 [employer].

## 2020-08-13 ENCOUNTER — Telehealth: Payer: Self-pay

## 2020-08-13 LAB — CULTURE, BLOOD (ROUTINE X 2)
Culture: NO GROWTH
Culture: NO GROWTH
Special Requests: ADEQUATE

## 2020-08-13 NOTE — Telephone Encounter (Signed)
Copied from CRM [id number]. Topic: Appointment Scheduling - Scheduling Inquiry for Clinic >> Aug 13, 2020 10:01 AM Daphine Deutscher D wrote: Reason for CRM: Pt was discharged yesterday with Covid pneumonia and will need a FU appt with Victorino Dike.  CB#  [phone]

## 2020-08-13 NOTE — Telephone Encounter (Signed)
Patient scheduled.

## 2020-08-13 NOTE — Telephone Encounter (Signed)
No HFU scheduled at this time. 

## 2020-08-13 NOTE — Telephone Encounter (Signed)
Transition Care Management Follow-up Telephone Call  Date of discharge and from where: [facility] on 08/12/20  How have you been since you were released from the hospital? Doing ok, but still very tired. Pt has been checking his oxygen level and it has been around 92. Pt is still coughing but not as bad as bad as previously. Pt has not had an appetite and is trying to catch back up on sleeping. Declines pain, fever, SOB, sputum or n/v/d.  Any questions or concerns? No   Items Reviewed:  Did the pt receive and understand the discharge instructions provided? Yes   Medications obtained and verified? Yes   Any new allergies since your discharge? No   Dietary orders reviewed? Yes  Do you have support at home? Yes   Other (ie: DME, Home Health, etc): Pt was d/c with a spirometer.  Functional Questionnaire: (I = Independent and D = Dependent)  Bathing/Dressing- I   Meal Prep- I  Eating- I  Maintaining continence- I  Transferring/Ambulation- I  Managing Meds- I   Follow up appointments reviewed:    PCP Hospital f/u appt confirmed? Yes  scheduled for MyChart virtual visit with Joycelyn Man on 08/15/20 @ 11:00 AM.  Specialist Hospital f/u appt confirmed? N/A  Are transportation arrangements needed? No   If their condition worsens, is the pt aware to call  their PCP or go to the ED? Yes  Was the patient provided with contact information for the PCP's office or ED? Yes  Was the pt encouraged to call back with questions or concerns? Yes

## 2020-08-14 NOTE — Progress Notes (Signed)
MyChart Video Visit    Virtual Visit via Video Note   This visit type was conducted due to national recommendations for restrictions regarding the COVID-19 Pandemic (e.g. social distancing) in an effort to limit this patient's exposure and mitigate transmission in our community. This patient is at least at moderate risk for complications without adequate follow up. This format is felt to be most appropriate for this patient at this time. Physical exam was limited by quality of the video and audio technology used for the visit.   I connected with [name]. on 08/15/20 at 11:00 AM EDT by a video enabled telemedicine application and verified that I am speaking with the correct person using two identifiers.  I discussed the limitations of evaluation and management by telemedicine and the availability of in person appointments. The patient expressed understanding and agreed to proceed.  Patient location: Home Provider location: [facility]  I discussed the limitations of evaluation and management by telemedicine and the availability of in person appointments. The patient expressed understanding and agreed to proceed.  Patient: Derek Wheeler.   DOB: 01/09/1986   34 y.o. Male  MRN: [id number] Visit Date: 08/15/2020  Today's healthcare provider: Margaretann Loveless, PA-C   Chief Complaint  Patient presents with  . Hospitalization Follow-up   Subjective    HPI  Follow up Hospitalization  Patient was admitted to [facility] on 08/08/20 and discharged on 08/12/20 He was treated for pneumonia due to Covid-19 virus Treatment for this included Remdesivir, Solumedrol 60mg  BID,Vitamin C,Zinc,Bronchodilators and PRN Mucinex for cough Telephone follow up was done on 08/13/20. He reports excellent compliance with treatment. He reports this condition is improved. Reports that he just feels tired. His O2 levels have been between 92-95%.Feels like if he talks a lot his chest feels tight.  Reports  overall he is doing much better. Does have some fatigue but improving. Using a spirometer and flutter valve for lung exercises. Is having lower extremity cramps. ----------------------------------------------------------------------------------------- -  Patient Active Problem List   Diagnosis Date Noted  . Pneumonia due to COVID-19 virus 08/08/2020  . GERD (gastroesophageal reflux disease) 08/08/2020  . Acute respiratory failure with hypoxia (HCC) 08/08/2020  . Acute appendicitis   . Hx of dysplastic nevus 09/10/2018  . History of Salmonella infection 03/08/2018  . Second hand smoke exposure 03/08/2018  . Class 2 obesity with body mass index (BMI) of 36.0 to 36.9 in adult 11/03/2017  . Hyperlipidemia 11/03/2017  . Right foot pain 11/03/2017   Past Medical History:  Diagnosis Date  . Hx of dysplastic nevus 09/10/2018   R upper back, moderate atypia      Medications: Outpatient Medications Prior to Visit  Medication Sig  . ascorbic acid (VITAMIN C) 500 MG tablet Take 1 tablet (500 mg total) by mouth daily for 7 days.  11/10/2018 dexamethasone (DECADRON) 0.5 MG tablet Take 1 tablet (0.5 mg total) by mouth 2 (two) times daily for 7 days.  Marland Kitchen omeprazole (PRILOSEC) 20 MG capsule Take 1 capsule (20 mg total) by mouth daily.  . ondansetron (ZOFRAN) 4 MG tablet Take 1 tablet (4 mg total) by mouth daily as needed for nausea or vomiting.  . zinc sulfate 220 (50 Zn) MG capsule Take 1 capsule (220 mg total) by mouth daily.  Marland Kitchen guaiFENesin-codeine 100-10 MG/5ML syrup Take 5 mLs by mouth every 6 (six) hours as needed for cough. (Patient not taking: Reported on 08/15/2020)   No facility-administered medications prior to visit.  Review of Systems  Constitutional: Positive for fatigue.  HENT: Negative.   Respiratory: Positive for chest tightness and shortness of breath.   Cardiovascular: Negative.   Gastrointestinal: Negative.   Musculoskeletal: Positive for myalgias (muscle cramps).  Neurological:  Negative.     Last CBC Lab Results  Component Value Date   WBC 20.3 (H) 08/12/2020   HGB 14.5 08/12/2020   HCT 41.6 08/12/2020   MCV 84.2 08/12/2020   MCH 29.4 08/12/2020   RDW 13.4 08/12/2020   PLT 576 (H) 08/12/2020   Last metabolic panel Lab Results  Component Value Date   GLUCOSE 139 (H) 08/12/2020   NA 144 08/12/2020   K 4.3 08/12/2020   CL 106 08/12/2020   CO2 26 08/12/2020   BUN 24 (H) 08/12/2020   CREATININE 0.97 08/12/2020   GFRNONAA >60 08/12/2020   GFRAA >60 08/12/2020   CALCIUM 9.1 08/12/2020   PROT 7.2 08/12/2020   ALBUMIN 3.6 08/12/2020   LABGLOB 3.1 06/07/2020   AGRATIO 1.6 06/07/2020   BILITOT 0.7 08/12/2020   ALKPHOS 63 08/12/2020   AST 45 (H) 08/12/2020   ALT 205 (H) 08/12/2020   ANIONGAP 12 08/12/2020      Objective    There were no vitals taken for this visit. BP Readings from Last 3 Encounters:  08/12/20 132/78  08/06/20 109/76  08/06/20 104/68   Wt Readings from Last 3 Encounters:  08/07/20 260 lb (117.9 kg)  08/06/20 260 lb (117.9 kg)  08/06/20 260 lb (117.9 kg)      Physical Exam Vitals reviewed.  Constitutional:      General: He is not in acute distress.    Appearance: Normal appearance. He is well-developed.  HENT:     Head: Normocephalic and atraumatic.  Eyes:     Conjunctiva/sclera: Conjunctivae normal.  Pulmonary:     Effort: Pulmonary effort is normal. No respiratory distress (able to talk in full sentences).  Musculoskeletal:     Cervical back: Normal range of motion and neck supple.  Neurological:     Mental Status: He is alert.  Psychiatric:        Mood and Affect: Mood normal.        Behavior: Behavior normal.        Thought Content: Thought content normal.        Judgment: Judgment normal.        Assessment & Plan     1. History of COVID-19 Slowly improving as expected. He is on STD until 08/29/20. May require part-time return as he does a lot of talking on the phone and gets chest tightness/SOB with  lots of talking. Continue lung exercises and moving around as tolerated. Call if symptoms worsen.   2. SOB (shortness of breath) See above medical treatment plan.  3. Muscle cramps Advised to add magnesium or electrolyte beverage of choice. Advised to monitor and see if occurring in same leg consistently or if leg swelling occurs to call office immediately so DVT could be ruled out. He voices understanding.     No follow-ups on file.     I discussed the assessment and treatment plan with the patient. The patient was provided an opportunity to ask questions and all were answered. The patient agreed with the plan and demonstrated an understanding of the instructions.   The patient was advised to call back or seek an in-person evaluation if the symptoms worsen or if the condition fails to improve as anticipated.  I provided 19 minutes of  non-face-to-face time during this encounter.  Delmer Islam, PA-C, have reviewed all documentation for this visit. The documentation on 08/15/20 for the exam, diagnosis, procedures, and orders are all accurate and complete.  Reine Just [facility] [phone] (phone) [phone] (fax)  [facility]

## 2020-08-15 ENCOUNTER — Telehealth (INDEPENDENT_AMBULATORY_CARE_PROVIDER_SITE_OTHER): Payer: 59 | Admitting: Physician Assistant

## 2020-08-15 ENCOUNTER — Encounter: Payer: Self-pay | Admitting: Physician Assistant

## 2020-08-15 DIAGNOSIS — R252 Cramp and spasm: Secondary | ICD-10-CM | POA: Diagnosis not present

## 2020-08-15 DIAGNOSIS — R0602 Shortness of breath: Secondary | ICD-10-CM | POA: Diagnosis not present

## 2020-08-15 DIAGNOSIS — Z8616 Personal history of COVID-19: Secondary | ICD-10-CM | POA: Diagnosis not present

## 2020-08-15 NOTE — Patient Instructions (Signed)
COVID-19 COVID-19 is a respiratory infection that is caused by a virus called severe acute respiratory syndrome coronavirus 2 (SARS-CoV-2). The disease is also known as coronavirus disease or novel coronavirus. In some people, the virus may not cause any symptoms. In others, it may cause a serious infection. The infection can get worse quickly and can lead to complications, such as:  Pneumonia, or infection of the lungs.  Acute respiratory distress syndrome or ARDS. This is a condition in which fluid build-up in the lungs prevents the lungs from filling with air and passing oxygen into the blood.  Acute respiratory failure. This is a condition in which there is not enough oxygen passing from the lungs to the body or when carbon dioxide is not passing from the lungs out of the body.  Sepsis or septic shock. This is a serious bodily reaction to an infection.  Blood clotting problems.  Secondary infections due to bacteria or fungus.  Organ failure. This is when your body's organs stop working. The virus that causes COVID-19 is contagious. This means that it can spread from person to person through droplets from coughs and sneezes (respiratory secretions). What are the causes? This illness is caused by a virus. You may catch the virus by:  Breathing in droplets from an infected person. Droplets can be spread by a person breathing, speaking, singing, coughing, or sneezing.  Touching something, like a table or a doorknob, that was exposed to the virus (contaminated) and then touching your mouth, nose, or eyes. What increases the risk? Risk for infection You are more likely to be infected with this virus if you:  Are within 6 feet (2 meters) of a person with COVID-19.  Provide care for or live with a person who is infected with COVID-19.  Spend time in crowded indoor spaces or live in shared housing. Risk for serious illness You are more likely to become seriously ill from the virus if you:   Are 50 years of age or older. The higher your age, the more you are at risk for serious illness.  Live in a nursing home or long-term care facility.  Have cancer.  Have a long-term (chronic) disease such as: ? Chronic lung disease, including chronic obstructive pulmonary disease or asthma. ? A long-term disease that lowers your body's ability to fight infection (immunocompromised). ? Heart disease, including heart failure, a condition in which the arteries that lead to the heart become narrow or blocked (coronary artery disease), a disease which makes the heart muscle thick, weak, or stiff (cardiomyopathy). ? Diabetes. ? Chronic kidney disease. ? Sickle cell disease, a condition in which red blood cells have an abnormal "sickle" shape. ? Liver disease.  Are obese. What are the signs or symptoms? Symptoms of this condition can range from mild to severe. Symptoms may appear any time from 2 to 14 days after being exposed to the virus. They include:  A fever or chills.  A cough.  Difficulty breathing.  Headaches, body aches, or muscle aches.  Runny or stuffy (congested) nose.  A sore throat.  New loss of taste or smell. Some people may also have stomach problems, such as nausea, vomiting, or diarrhea. Other people may not have any symptoms of COVID-19. How is this diagnosed? This condition may be diagnosed based on:  Your signs and symptoms, especially if: ? You live in an area with a COVID-19 outbreak. ? You recently traveled to or from an area where the virus is common. ? You   provide care for or live with a person who was diagnosed with COVID-19. ? You were exposed to a person who was diagnosed with COVID-19.  A physical exam.  Lab tests, which may include: ? Taking a sample of fluid from the back of your nose and throat (nasopharyngeal fluid), your nose, or your throat using a swab. ? A sample of mucus from your lungs (sputum). ? Blood tests.  Imaging tests, which  may include, X-rays, CT scan, or ultrasound. How is this treated? At present, there is no medicine to treat COVID-19. Medicines that treat other diseases are being used on a trial basis to see if they are effective against COVID-19. Your health care provider will talk with you about ways to treat your symptoms. For most people, the infection is mild and can be managed at home with rest, fluids, and over-the-counter medicines. Treatment for a serious infection usually takes places in a hospital intensive care unit (ICU). It may include one or more of the following treatments. These treatments are given until your symptoms improve.  Receiving fluids and medicines through an IV.  Supplemental oxygen. Extra oxygen is given through a tube in the nose, a face mask, or a hood.  Positioning you to lie on your stomach (prone position). This makes it easier for oxygen to get into the lungs.  Continuous positive airway pressure (CPAP) or bi-level positive airway pressure (BPAP) machine. This treatment uses mild air pressure to keep the airways open. A tube that is connected to a motor delivers oxygen to the body.  Ventilator. This treatment moves air into and out of the lungs by using a tube that is placed in your windpipe.  Tracheostomy. This is a procedure to create a hole in the neck so that a breathing tube can be inserted.  Extracorporeal membrane oxygenation (ECMO). This procedure gives the lungs a chance to recover by taking over the functions of the heart and lungs. It supplies oxygen to the body and removes carbon dioxide. Follow these instructions at home: Lifestyle  If you are sick, stay home except to get medical care. Your health care provider will tell you how long to stay home. Call your health care provider before you go for medical care.  Rest at home as told by your health care provider.  Do not use any products that contain nicotine or tobacco, such as cigarettes, e-cigarettes, and  chewing tobacco. If you need help quitting, ask your health care provider.  Return to your normal activities as told by your health care provider. Ask your health care provider what activities are safe for you. General instructions  Take over-the-counter and prescription medicines only as told by your health care provider.  Drink enough fluid to keep your urine pale yellow.  Keep all follow-up visits as told by your health care provider. This is important. How is this prevented?  There is no vaccine to help prevent COVID-19 infection. However, there are steps you can take to protect yourself and others from this virus. To protect yourself:   Do not travel to areas where COVID-19 is a risk. The areas where COVID-19 is reported change often. To identify high-risk areas and travel restrictions, check the CDC travel website: wwwnc.cdc.gov/travel/notices  If you live in, or must travel to, an area where COVID-19 is a risk, take precautions to avoid infection. ? Stay away from people who are sick. ? Wash your hands often with soap and water for 20 seconds. If soap and water   are not available, use an alcohol-based hand sanitizer. ? Avoid touching your mouth, face, eyes, or nose. ? Avoid going out in public, follow guidance from your state and local health authorities. ? If you must go out in public, wear a cloth face covering or face mask. Make sure your mask covers your nose and mouth. ? Avoid crowded indoor spaces. Stay at least 6 feet (2 meters) away from others. ? Disinfect objects and surfaces that are frequently touched every day. This may include:  Counters and tables.  Doorknobs and light switches.  Sinks and faucets.  Electronics, such as phones, remote controls, keyboards, computers, and tablets. To protect others: If you have symptoms of COVID-19, take steps to prevent the virus from spreading to others.  If you think you have a COVID-19 infection, contact your health care  provider right away. Tell your health care team that you think you may have a COVID-19 infection.  Stay home. Leave your house only to seek medical care. Do not use public transport.  Do not travel while you are sick.  Wash your hands often with soap and water for 20 seconds. If soap and water are not available, use alcohol-based hand sanitizer.  Stay away from other members of your household. Let healthy household members care for children and pets, if possible. If you have to care for children or pets, wash your hands often and wear a mask. If possible, stay in your own room, separate from others. Use a different bathroom.  Make sure that all people in your household wash their hands well and often.  Cough or sneeze into a tissue or your sleeve or elbow. Do not cough or sneeze into your hand or into the air.  Wear a cloth face covering or face mask. Make sure your mask covers your nose and mouth. Where to find more information  Centers for Disease Control and Prevention: www.cdc.gov/coronavirus/2019-ncov/index.html  World Health Organization: www.who.int/health-topics/coronavirus Contact a health care provider if:  You live in or have traveled to an area where COVID-19 is a risk and you have symptoms of the infection.  You have had contact with someone who has COVID-19 and you have symptoms of the infection. Get help right away if:  You have trouble breathing.  You have pain or pressure in your chest.  You have confusion.  You have bluish lips and fingernails.  You have difficulty waking from sleep.  You have symptoms that get worse. These symptoms may represent a serious problem that is an emergency. Do not wait to see if the symptoms will go away. Get medical help right away. Call your local emergency services (911 in the [place] Do not drive yourself to the hospital. Let the emergency medical personnel know if you think you have COVID-19. Summary  COVID-19 is a  respiratory infection that is caused by a virus. It is also known as coronavirus disease or novel coronavirus. It can cause serious infections, such as pneumonia, acute respiratory distress syndrome, acute respiratory failure, or sepsis.  The virus that causes COVID-19 is contagious. This means that it can spread from person to person through droplets from breathing, speaking, singing, coughing, or sneezing.  You are more likely to develop a serious illness if you are 50 years of age or older, have a weak immune system, live in a nursing home, or have chronic disease.  There is no medicine to treat COVID-19. Your health care provider will talk with you about ways to treat your symptoms.    Take steps to protect yourself and others from infection. Wash your hands often and disinfect objects and surfaces that are frequently touched every day. Stay away from people who are sick and wear a mask if you are sick. This information is not intended to replace advice given to you by your health care provider. Make sure you discuss any questions you have with your health care provider. Document Revised: 09/16/2019 Document Reviewed: 12/23/2018 Elsevier Patient Education  2020 [employer]  

## 2020-08-17 ENCOUNTER — Encounter: Payer: Self-pay | Admitting: Physician Assistant

## 2021-06-19 ENCOUNTER — Ambulatory Visit: Payer: Self-pay

## 2021-06-19 NOTE — Telephone Encounter (Signed)
FYI

## 2021-06-19 NOTE — Telephone Encounter (Signed)
Called and spoke with patient and advised him that office visit would be needed for evaluation and treatment. Both our office and [facility] are fully booked today and [facility] only has two openings left in afternoon on 06/19/2021. Informed patient of [facility] on [address] and there walk in clinic hours between 1-7pm, also gave patient option to contact [facility] to arrange appointment. Patient states he will call our office back when he makes a decision on what to do. KW

## 2021-06-19 NOTE — Telephone Encounter (Signed)
Answer Assessment - Initial Assessment Questions 1. MECHANISM: "How did the injury happen?"      Boxing 2. ONSET: "When did the injury happen?" (Minutes or hours ago)      Monday 3. LOCATION: "What part of the finger is injured?" "Is the nail damaged?"      Left thumb 4. APPEARANCE of the INJURY: "What does the injury look like?"      Swelling, pink 5. SEVERITY: "Can you use the hand normally?"  "Can you bend your fingers into a ball and then fully open them?"     Can move it 6. SIZE: For cuts, bruises, or swelling, ask: "How large is it?" (e.g., inches or centimeters;  entire finger)      No 7. PAIN: "Is there pain?" If Yes, ask: "How bad is the pain?"    (e.g., Scale 1-10; or mild, moderate, severe)  - NONE (0): no pain.  - MILD (1-3): doesn't interfere with normal activities.   - MODERATE (4-7): interferes with normal activities or awakens from sleep.  - SEVERE (8-10): excruciating pain, unable to hold a glass of water or bend finger even a little.     Moderate 8. TETANUS: For any breaks in the skin, ask: "When was the last tetanus booster?"     Unsure 9. OTHER SYMPTOMS: "Do you have any other symptoms?"     No 10. PREGNANCY: "Is there any chance you are pregnant?" "When was your last menstrual period?"       N/a  Protocols used: Finger Injury-A-AH

## 2021-06-19 NOTE — Telephone Encounter (Signed)
Pt. Reports he was boxing Monday and injured left thumb. Thumb is swollen and painful to move. Requests appointment or x-ray order. Instructed pt. He would need to be seen for x-ray order. No availability. Instructed he could go to [facility] or [facility]. States he has to pay out of pocket "and I want to keep down the cost." Please advise.

## 2022-02-21 ENCOUNTER — Encounter: Payer: Self-pay | Admitting: Physician Assistant

## 2022-02-21 ENCOUNTER — Ambulatory Visit (INDEPENDENT_AMBULATORY_CARE_PROVIDER_SITE_OTHER): Payer: 59 | Admitting: Physician Assistant

## 2022-02-21 ENCOUNTER — Other Ambulatory Visit: Payer: Self-pay

## 2022-02-21 VITALS — BP 123/86 | HR 95 | Ht 70.0 in | Wt 244.8 lb

## 2022-02-21 DIAGNOSIS — Z6835 Body mass index (BMI) 35.0-35.9, adult: Secondary | ICD-10-CM

## 2022-02-21 DIAGNOSIS — Z1159 Encounter for screening for other viral diseases: Secondary | ICD-10-CM | POA: Diagnosis not present

## 2022-02-21 DIAGNOSIS — S7012XA Contusion of left thigh, initial encounter: Secondary | ICD-10-CM

## 2022-02-21 DIAGNOSIS — Z0000 Encounter for general adult medical examination without abnormal findings: Secondary | ICD-10-CM

## 2022-02-21 DIAGNOSIS — M25512 Pain in left shoulder: Secondary | ICD-10-CM | POA: Diagnosis not present

## 2022-02-21 MED ORDER — CELECOXIB 100 MG PO CAPS: 100.0000 mg | ORAL_CAPSULE | Freq: Two times a day (BID) | ORAL | 0 refills | Status: DC

## 2022-02-21 NOTE — Progress Notes (Signed)
? ?I,Lindsay Drubel,acting as a scribe for [employer], PA-C.,have documented all relevant documentation on the behalf of Mikey Kirschner, PA-C,as directed by  Mikey Kirschner, PA-C while in the presence of Mikey Kirschner, PA-C. ? ?Complete physical exam ? ? ?Patient: Derek Wheeler.   DOB: 1986-08-13   36 y.o. Male  MRN: [id number] ?Visit Date: 02/21/2022 ? ?Today's healthcare provider: Mikey Kirschner, PA-C  ? ?Chief Complaint  ?Patient presents with  ? Annual Exam  ? ?Subjective  ?  ?Blas Riches. is a 36 y.o. male who presents today for a complete physical exam.  ?He reports consuming a general and intermittent fasting eating about 1x a day.  diet.  The patient reports boxing 2-3xs a week for at least an hour  He generally feels fairly well. He reports sleeping well. He does not have additional problems to discuss today.  ?HPI  ? ?Reports three weeks ago working in his attic and falling through his ceiling. Significant bruising on his left thigh. Discoloration has resolved, but there is still a 'lump' that is tender to touch. Denies issues with ROM, ambulation.  ? ?Reports chronic L shoulder issues he attributes to boxing, feels a slipping or catching with flexion of his left shoulder, this eventually becomes tender and painful. When in pain, his ROM is limited.  ?Denies injury, paresthesias or weakness.  ? ?Past Medical History:  ?Diagnosis Date  ? Hx of dysplastic nevus 09/10/2018  ? R upper back, moderate atypia  ? ?Past Surgical History:  ?Procedure Laterality Date  ? LAPAROSCOPIC APPENDECTOMY N/A 09/23/2018  ? Procedure: APPENDECTOMY LAPAROSCOPIC;  Surgeon: Jules Husbands, MD;  Location: [facility]S;  Service: General;  Laterality: N/A;  ? NO PAST SURGERIES    ? ?Social History  ? ?Socioeconomic History  ? Marital status: Married  ?  Spouse name: Not on file  ? Number of children: Not on file  ? Years of education: Not on file  ? Highest education level: Not on file  ?Occupational History  ? Not on file   ?Tobacco Use  ? Smoking status: Never  ? Smokeless tobacco: Never  ?Vaping Use  ? Vaping Use: Never used  ?Substance and Sexual Activity  ? Alcohol use: Not Currently  ?  Alcohol/week: 2.0 standard drinks  ?  Types: 2 Cans of beer per week  ?  Comment: on weekends  ? Drug use: No  ? Sexual activity: Yes  ?Other Topics Concern  ? Not on file  ?Social History Narrative  ? Not on file  ? ?Social Determinants of Health  ? ?Financial Resource Strain: Not on file  ?Food Insecurity: Not on file  ?Transportation Needs: Not on file  ?Physical Activity: Not on file  ?Stress: Not on file  ?Social Connections: Not on file  ?Intimate Partner Violence: Not on file  ? ?Family Status  ?Relation Name Status  ? Mother  Alive  ? Father  Alive  ? MGF  Deceased  ? ?Family History  ?Problem Relation Age of Onset  ? Hypertension Mother   ? Hyperlipidemia Mother   ? Arthritis Father   ? Hyperlipidemia Father   ? Hypertension Maternal Grandfather   ? Skin cancer Maternal Grandfather   ? ?No Known Allergies  ?Patient Care Team: ?Emelia Loron as PCP - General (Physician Assistant)  ? ?Medications: ?Outpatient Medications Prior to Visit  ?Medication Sig  ? [DISCONTINUED] guaiFENesin-codeine 100-10 MG/5ML syrup Take 5 mLs by mouth every 6 (six) hours as needed  for cough. (Patient not taking: Reported on 08/15/2020)  ? [DISCONTINUED] omeprazole (PRILOSEC) 20 MG capsule Take 1 capsule (20 mg total) by mouth daily. (Patient not taking: Reported on 02/21/2022)  ? [DISCONTINUED] ondansetron (ZOFRAN) 4 MG tablet Take 1 tablet (4 mg total) by mouth daily as needed for nausea or vomiting. (Patient not taking: Reported on 02/21/2022)  ? ?No facility-administered medications prior to visit.  ? ? ?Review of Systems  ?Genitourinary:  Positive for frequency.  ?Musculoskeletal:  Positive for arthralgias and myalgias.  ?Allergic/Immunologic: Positive for environmental allergies.  ?All other systems reviewed and are negative. ? ? ? Objective  ?  ?BP  123/86 (BP Location: Right Arm, Patient Position: Sitting, Cuff Size: Large)   Pulse 95   Ht 5' 10" (1.778 m)   Wt 244 lb 12.8 oz (111 kg)   SpO2 100%   BMI 35.13 kg/m?  ? ? ? ?Physical Exam ?Constitutional:   ?   General: He is awake.  ?   Appearance: He is well-developed.  ?HENT:  ?   Head: Normocephalic.  ?   Right Ear: Tympanic membrane, ear canal and external ear normal.  ?   Left Ear: Tympanic membrane, ear canal and external ear normal.  ?   Nose: Nose normal. No congestion or rhinorrhea.  ?   Mouth/Throat:  ?   Mouth: Mucous membranes are moist.  ?   Pharynx: No oropharyngeal exudate or posterior oropharyngeal erythema.  ?Eyes:  ?   Pupils: Pupils are equal, round, and reactive to light.  ?Cardiovascular:  ?   Rate and Rhythm: Normal rate and regular rhythm.  ?   Heart sounds: Normal heart sounds.  ?Pulmonary:  ?   Effort: Pulmonary effort is normal.  ?   Breath sounds: Normal breath sounds.  ?Abdominal:  ?   General: There is no distension.  ?   Palpations: Abdomen is soft.  ?   Tenderness: There is no abdominal tenderness. There is no guarding.  ?Musculoskeletal:  ?   Cervical back: Normal range of motion.  ?   Right lower leg: No edema.  ?   Left lower leg: No edema.  ?   Comments: L lateral thigh with a 3-4 cm tender ridge, around 5 cm above knee.  ?No ecchymosis, edema, erythema. ? ?L shoulder nontender to palpation full ROM currently without crepitus.   ?Lymphadenopathy:  ?   Cervical: No cervical adenopathy.  ?Skin: ?   General: Skin is warm.  ?Neurological:  ?   Mental Status: He is alert and oriented to person, place, and time.  ?Psychiatric:     ?   Attention and Perception: Attention normal.     ?   Mood and Affect: Mood normal.     ?   Speech: Speech normal.     ?   Behavior: Behavior normal. Behavior is cooperative.  ?  ? ?Last depression screening scores ? ?  02/21/2022  ?  2:58 PM 10/30/2017  ?  9:19 AM  ?PHQ 2/9 Scores  ?PHQ - 2 Score 2 1  ?PHQ- 9 Score 4 6  ? ? ?Results for orders placed  or performed in visit on 02/21/22  ?Comprehensive Metabolic Panel (CMET)  ?Result Value Ref Range  ? Glucose 90 70 - 99 mg/dL  ? BUN 14 6 - 20 mg/dL  ? Creatinine, Ser 1.05 0.76 - 1.27 mg/dL  ? eGFR 96 >59 mL/min/1.73  ? BUN/Creatinine Ratio 13 9 - 20  ? Sodium 143 134 -  144 mmol/L  ? Potassium 4.5 3.5 - 5.2 mmol/L  ? Chloride 102 96 - 106 mmol/L  ? CO2 24 20 - 29 mmol/L  ? Calcium 10.0 8.7 - 10.2 mg/dL  ? Total Protein 7.8 6.0 - 8.5 g/dL  ? Albumin 5.2 (H) 4.0 - 5.0 g/dL  ? Globulin, Total 2.6 1.5 - 4.5 g/dL  ? Albumin/Globulin Ratio 2.0 1.2 - 2.2  ? Bilirubin Total 0.4 0.0 - 1.2 mg/dL  ? Alkaline Phosphatase 100 44 - 121 IU/L  ? AST 20 0 - 40 IU/L  ? ALT 24 0 - 44 IU/L  ?Lipid Profile  ?Result Value Ref Range  ? Cholesterol, Total 228 (H) 100 - 199 mg/dL  ? Triglycerides 242 (H) 0 - 149 mg/dL  ? HDL 37 (L) >39 mg/dL  ? VLDL Cholesterol Cal 44 (H) 5 - 40 mg/dL  ? LDL Chol Calc (NIH) 147 (H) 0 - 99 mg/dL  ? Chol/HDL Ratio 6.2 (H) 0.0 - 5.0 ratio  ?HgB A1c  ?Result Value Ref Range  ? Hgb A1c MFr Bld 5.6 4.8 - 5.6 %  ? Est. average glucose Bld gHb Est-mCnc 114 mg/dL  ?CBC w/Diff/Platelet  ?Result Value Ref Range  ? WBC 9.0 3.4 - 10.8 x10E3/uL  ? RBC 5.26 4.14 - 5.80 x10E6/uL  ? Hemoglobin 14.9 13.0 - 17.7 g/dL  ? Hematocrit 44.6 37.5 - 51.0 %  ? MCV 85 79 - 97 fL  ? MCH 28.3 26.6 - 33.0 pg  ? MCHC 33.4 31.5 - 35.7 g/dL  ? RDW 13.4 11.6 - 15.4 %  ? Platelets 411 150 - 450 x10E3/uL  ? Neutrophils 57 Not Estab. %  ? Lymphs 33 Not Estab. %  ? Monocytes 9 Not Estab. %  ? Eos 0 Not Estab. %  ? Basos 1 Not Estab. %  ? Neutrophils Absolute 5.2 1.4 - 7.0 x10E3/uL  ? Lymphocytes Absolute 2.9 0.7 - 3.1 x10E3/uL  ? Monocytes Absolute 0.8 0.1 - 0.9 x10E3/uL  ? EOS (ABSOLUTE) 0.0 0.0 - 0.4 x10E3/uL  ? Basophils Absolute 0.1 0.0 - 0.2 x10E3/uL  ? Immature Granulocytes 0 Not Estab. %  ? Immature Grans (Abs) 0.0 0.0 - 0.1 x10E3/uL  ?TSH + free T4  ?Result Value Ref Range  ? TSH 1.770 0.450 - 4.500 uIU/mL  ? Free T4 1.34 0.82 - 1.77  ng/dL  ?Hepatitis C antibody  ?Result Value Ref Range  ? Hep C Virus Ab Non Reactive Non Reactive  ? ? Assessment & Plan  ?  ?Routine Health Maintenance and Physical Exam ? ?Exercise Activities and Dietary recomm

## 2022-02-22 LAB — COMPREHENSIVE METABOLIC PANEL
ALT: 24 IU/L (ref 0–44)
AST: 20 IU/L (ref 0–40)
Albumin/Globulin Ratio: 2 (ref 1.2–2.2)
Albumin: 5.2 g/dL — ABNORMAL HIGH (ref 4.0–5.0)
Alkaline Phosphatase: 100 IU/L (ref 44–121)
BUN/Creatinine Ratio: 13 (ref 9–20)
BUN: 14 mg/dL (ref 6–20)
Bilirubin Total: 0.4 mg/dL (ref 0.0–1.2)
CO2: 24 mmol/L (ref 20–29)
Calcium: 10 mg/dL (ref 8.7–10.2)
Chloride: 102 mmol/L (ref 96–106)
Creatinine, Ser: 1.05 mg/dL (ref 0.76–1.27)
Globulin, Total: 2.6 g/dL (ref 1.5–4.5)
Glucose: 90 mg/dL (ref 70–99)
Potassium: 4.5 mmol/L (ref 3.5–5.2)
Sodium: 143 mmol/L (ref 134–144)
Total Protein: 7.8 g/dL (ref 6.0–8.5)
eGFR: 96 mL/min/{1.73_m2} (ref >59)

## 2022-02-22 LAB — LIPID PANEL
Chol/HDL Ratio: 6.2 ratio — ABNORMAL HIGH (ref 0.0–5.0)
Cholesterol, Total: 228 mg/dL — ABNORMAL HIGH (ref 100–199)
HDL: 37 mg/dL — ABNORMAL LOW (ref >39)
LDL Chol Calc (NIH): 147 mg/dL — ABNORMAL HIGH (ref 0–99)
Triglycerides: 242 mg/dL — ABNORMAL HIGH (ref 0–149)
VLDL Cholesterol Cal: 44 mg/dL — ABNORMAL HIGH (ref 5–40)

## 2022-02-22 LAB — CBC WITH DIFFERENTIAL/PLATELET
Basophils Absolute: 0.1 10*3/uL (ref 0.0–0.2)
Basos: 1 %
EOS (ABSOLUTE): 0 10*3/uL (ref 0.0–0.4)
Eos: 0 %
Hematocrit: 44.6 % (ref 37.5–51.0)
Hemoglobin: 14.9 g/dL (ref 13.0–17.7)
Immature Grans (Abs): 0 10*3/uL (ref 0.0–0.1)
Immature Granulocytes: 0 %
Lymphocytes Absolute: 2.9 10*3/uL (ref 0.7–3.1)
Lymphs: 33 %
MCH: 28.3 pg (ref 26.6–33.0)
MCHC: 33.4 g/dL (ref 31.5–35.7)
MCV: 85 fL (ref 79–97)
Monocytes Absolute: 0.8 10*3/uL (ref 0.1–0.9)
Monocytes: 9 %
Neutrophils Absolute: 5.2 10*3/uL (ref 1.4–7.0)
Neutrophils: 57 %
Platelets: 411 10*3/uL (ref 150–450)
RBC: 5.26 x10E6/uL (ref 4.14–5.80)
RDW: 13.4 % (ref 11.6–15.4)
WBC: 9 10*3/uL (ref 3.4–10.8)

## 2022-02-22 LAB — TSH+FREE T4
Free T4: 1.34 ng/dL (ref 0.82–1.77)
TSH: 1.77 u[IU]/mL (ref 0.450–4.500)

## 2022-02-22 LAB — HEMOGLOBIN A1C
Est. average glucose Bld gHb Est-mCnc: 114 mg/dL
Hgb A1c MFr Bld: 5.6 % (ref 4.8–5.6)

## 2022-02-22 LAB — HEPATITIS C ANTIBODY: Hep C Virus Ab: NONREACTIVE

## 2022-02-24 ENCOUNTER — Encounter: Payer: Self-pay | Admitting: Physician Assistant

## 2022-02-25 ENCOUNTER — Encounter: Payer: Self-pay | Admitting: Physician Assistant

## 2022-02-25 DIAGNOSIS — M25512 Pain in left shoulder: Secondary | ICD-10-CM | POA: Insufficient documentation

## 2022-02-25 DIAGNOSIS — S7012XA Contusion of left thigh, initial encounter: Secondary | ICD-10-CM | POA: Insufficient documentation

## 2022-02-25 NOTE — Assessment & Plan Note (Signed)
Advised more caution and support before boxing -- stretching, icing.  ?If acutely painful, rx celebrex can take BID PRN ?If this pain begins to limit exercise, movement, or if his shoulder feels 'frozen' please call office ?Discussed PT, pt feels he is not at the point where he needs it yet ?

## 2022-02-25 NOTE — Assessment & Plan Note (Signed)
Likely just a severe muscle contusion as no limit or pain with ROM ?Advised ice, massage.  ?If does not resolve in the next 2-3 weeks please return to office ?

## 2023-07-02 ENCOUNTER — Ambulatory Visit (INDEPENDENT_AMBULATORY_CARE_PROVIDER_SITE_OTHER): Payer: 59 | Admitting: Physician Assistant

## 2023-07-02 ENCOUNTER — Ambulatory Visit: Payer: Self-pay

## 2023-07-02 ENCOUNTER — Encounter: Payer: Self-pay | Admitting: Physician Assistant

## 2023-07-02 VITALS — BP 130/89 | HR 100 | Temp 98.4°F | Ht 70.0 in | Wt 261.8 lb

## 2023-07-02 DIAGNOSIS — M546 Pain in thoracic spine: Secondary | ICD-10-CM

## 2023-07-02 DIAGNOSIS — Z833 Family history of diabetes mellitus: Secondary | ICD-10-CM

## 2023-07-02 DIAGNOSIS — R4589 Other symptoms and signs involving emotional state: Secondary | ICD-10-CM | POA: Diagnosis not present

## 2023-07-02 DIAGNOSIS — M25512 Pain in left shoulder: Secondary | ICD-10-CM

## 2023-07-02 MED ORDER — CELECOXIB 100 MG PO CAPS: 100.0000 mg | ORAL_CAPSULE | Freq: Two times a day (BID) | ORAL | 0 refills | Status: AC

## 2023-07-02 MED ORDER — CYCLOBENZAPRINE HCL 5 MG PO TABS: 5.0000 mg | ORAL_TABLET | Freq: Three times a day (TID) | ORAL | 1 refills | Status: AC | PRN

## 2023-07-02 NOTE — Telephone Encounter (Signed)
Chief Complaint: Back pain Symptoms: Severe back pain mostly at night when lying down Frequency: constant Pertinent Negatives: Patient denies chest pain, SOB, weakness, numbness Disposition: [] ED /[] Urgent Care (no appt availability in office) / [x] Appointment(In office/virtual)/ []  [place]/ [] Home Care/ [] Refused Recommended Disposition /[] [place] [facility]/ []  Follow-up with PCP Additional Notes: Patient states he has been experiencing severe back pain since Sunday night. Patient reports pain 7/10 at night and during the day he has pain it just isn't as bad as when he tries to lay down. Patient states he did move heavy equipment around recently for a family reunion but does not believe that is the cause of the back pain. Advised patient that he would need to be evaluated. Patient is agreeable. Patient scheduled today at 1520.   Reason for Disposition  [1] MODERATE back pain (e.g., interferes with normal activities) AND [2] present > 3 days  Answer Assessment - Initial Assessment Questions 1. ONSET: "When did the pain begin?"      Sunday or Monday this week 2. LOCATION: "Where does it hurt?" (upper, mid or lower back)     Mid back between my shoulder blades 3. SEVERITY: "How bad is the pain?"  (e.g., Scale 1-10; mild, moderate, or severe)   - MILD (1-3): Doesn't interfere with normal activities.    - MODERATE (4-7): Interferes with normal activities or awakens from sleep.    - SEVERE (8-10): Excruciating pain, unable to do any normal activities.      6-7/10 4. PATTERN: "Is the pain constant?" (e.g., yes, no; constant, intermittent)      No intermittent, it comes and goes  5. RADIATION: "Does the pain shoot into your legs or somewhere else?"     No 6. CAUSE:  "What do you think is causing the back pain?"      It only hurts when lying down 7. BACK OVERUSE:  "Any recent lifting of heavy objects, strenuous work or exercise?"     Moving equipment at a family reunion 38.  MEDICINES: "What have you taken so far for the pain?" (e.g., nothing, acetaminophen, NSAIDS)     Celebrex, tylenol 9. NEUROLOGIC SYMPTOMS: "Do you have any weakness, numbness, or problems with bowel/bladder control?"     No 10. OTHER SYMPTOMS: "Do you have any other symptoms?" (e.g., fever, abdomen pain, burning with urination, blood in urine)       No  Protocols used: Back Pain-A-AH
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
## 2023-07-04 DIAGNOSIS — M546 Pain in thoracic spine: Secondary | ICD-10-CM | POA: Insufficient documentation

## 2023-07-04 DIAGNOSIS — R4589 Other symptoms and signs involving emotional state: Secondary | ICD-10-CM | POA: Insufficient documentation

## 2023-07-04 DIAGNOSIS — Z833 Family history of diabetes mellitus: Secondary | ICD-10-CM | POA: Insufficient documentation

## 2024-01-13 ENCOUNTER — Encounter: Payer: Self-pay | Admitting: Physician Assistant

## 2024-01-13 ENCOUNTER — Ambulatory Visit (INDEPENDENT_AMBULATORY_CARE_PROVIDER_SITE_OTHER): Payer: 59 | Admitting: Physician Assistant

## 2024-01-13 VITALS — BP 138/90 | HR 107 | Temp 99.0°F | Ht 70.0 in | Wt 265.0 lb

## 2024-01-13 DIAGNOSIS — E7849 Other hyperlipidemia: Secondary | ICD-10-CM | POA: Diagnosis not present

## 2024-01-13 DIAGNOSIS — R109 Unspecified abdominal pain: Secondary | ICD-10-CM | POA: Diagnosis not present

## 2024-01-13 DIAGNOSIS — Z833 Family history of diabetes mellitus: Secondary | ICD-10-CM | POA: Diagnosis not present

## 2024-01-13 DIAGNOSIS — E669 Obesity, unspecified: Secondary | ICD-10-CM

## 2024-01-13 DIAGNOSIS — K219 Gastro-esophageal reflux disease without esophagitis: Secondary | ICD-10-CM

## 2024-01-13 DIAGNOSIS — R030 Elevated blood-pressure reading, without diagnosis of hypertension: Secondary | ICD-10-CM

## 2024-01-13 DIAGNOSIS — F339 Major depressive disorder, recurrent, unspecified: Secondary | ICD-10-CM

## 2024-01-13 NOTE — Progress Notes (Signed)
Established patient visit  Patient: Derek Wheeler.   DOB: [birth year]   37 y.o. Male  MRN: [id number] Visit Date: 01/13/2024  Today's healthcare provider: Debera Lat, PA-C   Chief Complaint  Patient presents with   Abdominal Pain    RUQ pain over 2 months.  He has had 2 episodes that lasted a couple of hours.  He states it feels tight and swollen.  He states he feels as though he can't stretch.  He did have a some pain radiate into his back the first episode but denies noticing it this last time.  First episode was 2 months ago, second was Sunday night.  He tried taking an antiacid but can't tell if it helped.    Subjective      Discussed the use of AI scribe software for clinical note transcription with the patient, who gave verbal consent to proceed.  History of Present Illness   The patient, with a known history of high cholesterol and high blood pressure, presents with recurrent right upper quadrant abdominal pain over the past two months. The pain is described as tight and swollen, radiating to the back. The episodes of pain have lasted for a couple of hours and have been associated with nausea and cold chills. The patient noticed a connection between the pain and consumption of greasy food during the first episode. The patient also reports discomfort when eating, describing a sensation of food getting stuck in the throat. The patient denies any history of similar pain prior to these episodes. The patient is currently taking Celebrex for shoulder pain, but has not taken it in months. The patient also reports a family history of high cholesterol and high blood pressure.         02/21/2022    2:58 PM 10/30/2017    9:19 AM  Depression screen PHQ 2/9  Decreased Interest 1 0  Down, Depressed, Hopeless 1 1  PHQ - 2 Score 2 1  Altered sleeping 0 2  Tired, decreased energy 1 2  Change in appetite 0 0  Feeling bad or failure about yourself  1 1  Trouble concentrating 0 0  Moving  slowly or fidgety/restless 0 0  Suicidal thoughts 0 0  PHQ-9 Score 4 6  Difficult doing work/chores Not difficult at all Not difficult at all       No data to display          Medications: Outpatient Medications Prior to Visit  Medication Sig   celecoxib (CELEBREX) 100 MG capsule Take 1 capsule (100 mg total) by mouth 2 (two) times daily.   cyclobenzaprine (FLEXERIL) 5 MG tablet Take 1 tablet (5 mg total) by mouth 3 (three) times daily as needed for muscle spasms.   No facility-administered medications prior to visit.    Review of Systems All negative Except see HPI       Objective    BP (!) 138/90   Pulse (!) 107   Temp 99 F (37.2 C) (Oral)   Ht 5\' 10"  (1.778 m)   Wt 265 lb (120.2 kg)   SpO2 100%   BMI 38.02 kg/m     Physical Exam Vitals reviewed.  Constitutional:      General: He is not in acute distress.    Appearance: Normal appearance. He is obese. He is not diaphoretic.  HENT:     Head: Normocephalic and atraumatic.  Eyes:     General: No scleral icterus.    Conjunctiva/sclera: Conjunctivae normal.  Cardiovascular:     Rate and Rhythm: Normal rate and regular rhythm.     Pulses: Normal pulses.     Heart sounds: Normal heart sounds. No murmur heard. Pulmonary:     Effort: Pulmonary effort is normal. No respiratory distress.     Breath sounds: Normal breath sounds. No wheezing or rhonchi.  Musculoskeletal:     Cervical back: Neck supple.     Right lower leg: No edema.     Left lower leg: No edema.  Lymphadenopathy:     Cervical: No cervical adenopathy.  Skin:    General: Skin is warm and dry.     Findings: No rash.  Neurological:     Mental Status: He is alert and oriented to person, place, and time. Mental status is at baseline.  Psychiatric:        Mood and Affect: Mood normal.        Behavior: Behavior normal.      No results found for any visits on 01/13/24.      Assessment and Plan    Recurrent abdominal pain  (Primary) workup - H. pylori breath test - CBC with Differential/Platelet - Comprehensive metabolic panel - Lipase - US Abdomen Limited RUQ (LIVER/GB); Future - Urinalysis, Routine w reflex microscopic Encouraged to complete wtn next two days RTC if symptoms recur  Obesity (HCC) Workup - Lipid panel - Hemoglobin A1c - TSH  Family history of diabetes mellitus (DM) -a1c\  Recurrent Right Upper Quadrant Abdominal Pain Two episodes over the past two months, each lasting a few hours. Described as a bloating sensation, discomfort, and inability to find a comfortable position. Associated with greasy food intake during the first episode. Mild nausea and chills during the second episode. No fever. No history of similar pain. -Order abdominal ultrasound to rule out cholecystitis or gallstones. -Order labs including lipase and H. pylori testing.  GERD/Dysphagia Occasional discomfort on swallowing, described as a sensation of food getting stuck. No palpable mass. -Order labs to investigate possible causes.  Hyperlipidemia History of elevated cholesterol levels. -Repeat lipid panel in 6 months.  Elevated Blood Pressure reading Blood pressure slightly elevated during the visit. -Monitor blood pressure daily for one month, alternating between morning and evening measurements. -Return for follow-up in one month with blood pressure log.  Depression Chronic and stable Reports managing symptoms without medication.  General Health Maintenance -Recommend low cholesterol diet. -Consider lifestyle modifications including diet and exercise. Substitutions to help cholesterol:   Substituting soy-based products (such as tofu or tempeh) for any meat in recipes   Substituting a lean meat such as (non-fried) poultry or fish (other than shrimp) for red meat in recipes   Replacing refined grain products with higher-fiber whole-grain products whenever possible   Drinking tea, carbonated water, or  plain water instead of sugar-sweetened soft drinks and fruit juices   Using a nut butter spread instead of traditional dairy butter  -Advise to eat more frequent, smaller meals with a focus on a substantial breakfast and lunch, and a light dinner. -Advise to avoid eating 5-6 hours before bedtime. -Recommend daily exercise, even simple activities like walking. -Order labs to check for diabetes. -Return for follow-up in one month.     Orders Placed This Encounter  Procedures   US Abdomen Limited RUQ (LIVER/GB)    Standing Status:   Future    Expiration Date:   01/12/2025    Reason for Exam (SYMPTOM  OR DIAGNOSIS REQUIRED):   RUQ pain, nausea, pressure  Preferred imaging location?:   [facility]   H. pylori breath test   CBC with Differential/Platelet   Comprehensive metabolic panel   Lipase   Urinalysis, Routine w reflex microscopic   Lipid panel    Has the patient fasted?:   Yes   Hemoglobin A1c   TSH    No follow-ups on file.   The patient was advised to call back or seek an in-person evaluation if the symptoms worsen or if the condition fails to improve as anticipated.  I discussed the assessment and treatment plan with the patient. The patient was provided an opportunity to ask questions and all were answered. The patient agreed with the plan and demonstrated an understanding of the instructions.  I, Debera Lat, PA-C have reviewed all documentation for this visit. The documentation on 01/13/2024  for the exam, diagnosis, procedures, and orders are all accurate and complete.  Debera Lat, [facility], MMS [facility] [phone] (phone) [phone] (fax)  [facility]

## 2024-01-14 ENCOUNTER — Encounter: Payer: Self-pay | Admitting: Physician Assistant

## 2024-01-14 ENCOUNTER — Ambulatory Visit
Admission: RE | Admit: 2024-01-14 | Discharge: 2024-01-14 | Disposition: A | Discharge status: Home / Self Care | Payer: BC Managed Care – PPO | Source: Ambulatory Visit | Attending: Physician Assistant | Admitting: Physician Assistant

## 2024-01-14 DIAGNOSIS — R109 Unspecified abdominal pain: Secondary | ICD-10-CM | POA: Diagnosis present

## 2024-01-15 ENCOUNTER — Other Ambulatory Visit: Payer: Self-pay | Admitting: Physician Assistant

## 2024-01-16 LAB — COMPREHENSIVE METABOLIC PANEL
ALT: 25 [IU]/L (ref 0–44)
AST: 20 [IU]/L (ref 0–40)
Albumin: 4.8 g/dL (ref 4.1–5.1)
Alkaline Phosphatase: 94 [IU]/L (ref 44–121)
BUN/Creatinine Ratio: 16 (ref 9–20)
BUN: 18 mg/dL (ref 6–20)
Bilirubin Total: 0.2 mg/dL (ref 0.0–1.2)
CO2: 18 mmol/L — ABNORMAL LOW (ref 20–29)
Calcium: 9.9 mg/dL (ref 8.7–10.2)
Chloride: 104 mmol/L (ref 96–106)
Creatinine, Ser: 1.16 mg/dL (ref 0.76–1.27)
Globulin, Total: 2.7 g/dL (ref 1.5–4.5)
Glucose: 90 mg/dL (ref 70–99)
Potassium: 4.1 mmol/L (ref 3.5–5.2)
Sodium: 139 mmol/L (ref 134–144)
Total Protein: 7.5 g/dL (ref 6.0–8.5)
eGFR: 84 mL/min/{1.73_m2} (ref >59)

## 2024-01-16 LAB — CBC WITH DIFFERENTIAL/PLATELET
Basophils Absolute: 0.1 10*3/uL (ref 0.0–0.2)
Basos: 1 %
EOS (ABSOLUTE): 0.1 10*3/uL (ref 0.0–0.4)
Eos: 1 %
Hematocrit: 44.9 % (ref 37.5–51.0)
Hemoglobin: 15.2 g/dL (ref 13.0–17.7)
Immature Grans (Abs): 0 10*3/uL (ref 0.0–0.1)
Immature Granulocytes: 0 %
Lymphocytes Absolute: 4.3 10*3/uL — ABNORMAL HIGH (ref 0.7–3.1)
Lymphs: 41 %
MCH: 29.6 pg (ref 26.6–33.0)
MCHC: 33.9 g/dL (ref 31.5–35.7)
MCV: 88 fL (ref 79–97)
Monocytes Absolute: 0.8 10*3/uL (ref 0.1–0.9)
Monocytes: 8 %
Neutrophils Absolute: 5.2 10*3/uL (ref 1.4–7.0)
Neutrophils: 49 %
Platelets: 436 10*3/uL (ref 150–450)
RBC: 5.13 x10E6/uL (ref 4.14–5.80)
RDW: 13.1 % (ref 11.6–15.4)
WBC: 10.4 10*3/uL (ref 3.4–10.8)

## 2024-01-16 LAB — URINALYSIS, ROUTINE W REFLEX MICROSCOPIC
Bilirubin, UA: NEGATIVE
Glucose, UA: NEGATIVE
Ketones, UA: NEGATIVE
Leukocytes,UA: NEGATIVE
Nitrite, UA: NEGATIVE
Protein,UA: NEGATIVE
RBC, UA: NEGATIVE
Specific Gravity, UA: 1.023 (ref 1.005–1.030)
Urobilinogen, Ur: 0.2 mg/dL (ref 0.2–1.0)
pH, UA: 5.5 (ref 5.0–7.5)

## 2024-01-16 LAB — LIPID PANEL
Chol/HDL Ratio: 7.9 {ratio} — ABNORMAL HIGH (ref 0.0–5.0)
Cholesterol, Total: 238 mg/dL — ABNORMAL HIGH (ref 100–199)
HDL: 30 mg/dL — ABNORMAL LOW (ref >39)
LDL Chol Calc (NIH): 135 mg/dL — ABNORMAL HIGH (ref 0–99)
Triglycerides: 401 mg/dL — ABNORMAL HIGH (ref 0–149)
VLDL Cholesterol Cal: 73 mg/dL — ABNORMAL HIGH (ref 5–40)

## 2024-01-16 LAB — H. PYLORI BREATH TEST: H pylori Breath Test: NEGATIVE

## 2024-01-16 LAB — HEMOGLOBIN A1C
Est. average glucose Bld gHb Est-mCnc: 117 mg/dL
Hgb A1c MFr Bld: 5.7 % — ABNORMAL HIGH (ref 4.8–5.6)

## 2024-01-16 LAB — LIPASE: Lipase: 20 U/L (ref 13–78)

## 2024-01-16 LAB — H. PYLORI BREATH COLLECTION

## 2024-01-16 LAB — TSH: TSH: 2.17 u[IU]/mL (ref 0.450–4.500)

## 2024-01-18 ENCOUNTER — Encounter: Payer: Self-pay | Admitting: Physician Assistant

## 2024-01-18 MED ORDER — SEMAGLUTIDE-WEIGHT MANAGEMENT 2.4 MG/0.75ML ~~LOC~~ SOAJ: 2.4000 mg | SUBCUTANEOUS | 1 refills | Status: AC

## 2024-01-18 MED ORDER — ATORVASTATIN CALCIUM 10 MG PO TABS: 10.0000 mg | ORAL_TABLET | Freq: Every day | ORAL | 3 refills | Status: DC

## 2024-01-18 MED ORDER — SEMAGLUTIDE-WEIGHT MANAGEMENT 1 MG/0.5ML ~~LOC~~ SOAJ: 1.0000 mg | SUBCUTANEOUS | 1 refills | Status: AC

## 2024-01-18 MED ORDER — URSODIOL 300 MG PO CAPS: 300.0000 mg | ORAL_CAPSULE | Freq: Two times a day (BID) | ORAL | 0 refills | Status: DC

## 2024-01-18 MED ORDER — SEMAGLUTIDE-WEIGHT MANAGEMENT 0.5 MG/0.5ML ~~LOC~~ SOAJ: 0.5000 mg | SUBCUTANEOUS | 1 refills | Status: AC

## 2024-01-18 MED ORDER — SEMAGLUTIDE-WEIGHT MANAGEMENT 1.7 MG/0.75ML ~~LOC~~ SOAJ: 1.7000 mg | SUBCUTANEOUS | 1 refills | Status: AC

## 2024-01-18 MED ORDER — SEMAGLUTIDE-WEIGHT MANAGEMENT 0.25 MG/0.5ML ~~LOC~~ SOAJ: 0.2500 mg | SUBCUTANEOUS | 0 refills | Status: AC

## 2024-01-18 NOTE — Progress Notes (Signed)
 Please, place a referral to surgery for evaluation of gallstones

## 2024-01-22 ENCOUNTER — Telehealth: Payer: Self-pay

## 2024-01-22 ENCOUNTER — Other Ambulatory Visit: Payer: Self-pay | Admitting: Physician Assistant

## 2024-01-22 ENCOUNTER — Other Ambulatory Visit (HOSPITAL_COMMUNITY): Payer: Self-pay

## 2024-01-22 NOTE — Telephone Encounter (Signed)
 Pharmacy Patient Advocate Encounter   Received notification from Patient Advice Request messages that prior authorization for Wegovy 0.25MG /0.5ML auto-injectors is required/requested.   Insurance verification completed.   The patient is insured through [facility] .   Per test claim: PA required; PA submitted to above mentioned insurance via CoverMyMeds Key/confirmation #[id number] Status is pending

## 2024-01-22 NOTE — Telephone Encounter (Signed)
 Copied from CRM [id number]. Topic: Clinical - Medication Refill >> Jan 22, 2024  3:38 PM Everette C wrote: Most Recent Primary Care Visit:  Provider: Debera Lat  Department: [id number]  Visit Type: OFFICE VISIT  Date: 07/02/2023  Medication: atorvastatin (LIPITOR) 10 MG tablet [[id number]]  ursodiol (ACTIGALL) 300 MG capsule [[id number]]  Semaglutide-Weight Management 0.25 MG/0.5ML SOAJ [[id number]]   Has the patient contacted their pharmacy? Yes (Agent: If no, request that the patient contact the pharmacy for the refill. If patient does not wish to contact the pharmacy document the reason why and proceed with request.) (Agent: If yes, when and what did the pharmacy advise?)  Is this the correct pharmacy for this prescription? Yes If no, delete pharmacy and type the correct one.  This is the patient's preferred pharmacy:  [id number] [place], [place] - [address] [place] [address] Phone: [phone] Fax: [phone]  Has the prescription been filled recently? Yes  Is the patient out of the medication? No  Has the patient been seen for an appointment in the last year OR does the patient have an upcoming appointment? Yes  Can we respond through MyChart? No  Agent: Please be advised that Rx refills may take up to 3 business days. We ask that you follow-up with your pharmacy.

## 2024-01-27 NOTE — Telephone Encounter (Signed)
 Pharmacy Patient Advocate Encounter  Received notification from [facility] that Prior Authorization for [facility] 0.25MG /0.5ML auto-injectors has been DENIED.  Full denial letter will be uploaded to the media tab. See denial reason below.   PA #/Case ID/Reference #: [id number] [id number]

## 2024-02-10 NOTE — Progress Notes (Unsigned)
 Established patient visit  Patient: Derek Wheeler.   DOB: [birth year]   37 y.o. Male  MRN: [id number] Visit Date: 02/11/2024  Today's healthcare provider: Debera Lat, PA-C   No chief complaint on file.  Subjective       Discussed the use of AI scribe software for clinical note transcription with the patient, who gave verbal consent to proceed.  History of Present Illness               02/21/2022    2:58 PM 10/30/2017    9:19 AM  Depression screen PHQ 2/9  Decreased Interest 1 0  Down, Depressed, Hopeless 1 1  PHQ - 2 Score 2 1  Altered sleeping 0 2  Tired, decreased energy 1 2  Change in appetite 0 0  Feeling bad or failure about yourself  1 1  Trouble concentrating 0 0  Moving slowly or fidgety/restless 0 0  Suicidal thoughts 0 0  PHQ-9 Score 4 6  Difficult doing work/chores Not difficult at all Not difficult at all       No data to display          Medications: Outpatient Medications Prior to Visit  Medication Sig   atorvastatin (LIPITOR) 10 MG tablet Take 1 tablet (10 mg total) by mouth daily.   celecoxib (CELEBREX) 100 MG capsule Take 1 capsule (100 mg total) by mouth 2 (two) times daily.   cyclobenzaprine (FLEXERIL) 5 MG tablet Take 1 tablet (5 mg total) by mouth 3 (three) times daily as needed for muscle spasms.   Semaglutide-Weight Management 0.25 MG/0.5ML SOAJ Inject 0.25 mg into the skin once a week for 28 days.   [START ON 02/16/2024] Semaglutide-Weight Management 0.5 MG/0.5ML SOAJ Inject 0.5 mg into the skin once a week for 28 days.   [START ON 03/16/2024] Semaglutide-Weight Management 1 MG/0.5ML SOAJ Inject 1 mg into the skin once a week for 28 days.   [START ON 04/14/2024] Semaglutide-Weight Management 1.7 MG/0.75ML SOAJ Inject 1.7 mg into the skin once a week for 28 days.   [START ON 05/13/2024] Semaglutide-Weight Management 2.4 MG/0.75ML SOAJ Inject 2.4 mg into the skin once a week for 28 days.   ursodiol (ACTIGALL) 300 MG capsule Take 1 capsule  (300 mg total) by mouth 2 (two) times daily.   No facility-administered medications prior to visit.    Review of Systems All negative Except see HPI   {Insert previous labs (optional):23779} {See past labs  Heme  Chem  Endocrine  Serology  Results Review (optional):1}   Objective    There were no vitals taken for this visit. {Insert last BP/Wt (optional):23777}{See vitals history (optional):1}   Physical Exam   No results found for any visits on 02/11/24.      Assessment and Plan             No orders of the defined types were placed in this encounter.   No follow-ups on file.   The patient was advised to call back or seek an in-person evaluation if the symptoms worsen or if the condition fails to improve as anticipated.  I discussed the assessment and treatment plan with the patient. The patient was provided an opportunity to ask questions and all were answered. The patient agreed with the plan and demonstrated an understanding of the instructions.  I, Debera Lat, PA-C have reviewed all documentation for this visit. The documentation on 02/11/2024  for the exam, diagnosis, procedures, and orders are all accurate and complete.  Debera Lat, [facility], MMS [facility] [phone] (phone) [phone] (fax)  [facility]

## 2024-02-11 ENCOUNTER — Ambulatory Visit (INDEPENDENT_AMBULATORY_CARE_PROVIDER_SITE_OTHER): Payer: BC Managed Care – PPO | Admitting: Physician Assistant

## 2024-02-11 ENCOUNTER — Encounter: Payer: Self-pay | Admitting: Physician Assistant

## 2024-02-11 VITALS — BP 126/87 | HR 80 | Resp 16 | Ht 70.0 in | Wt 262.0 lb

## 2024-02-11 DIAGNOSIS — R5383 Other fatigue: Secondary | ICD-10-CM | POA: Insufficient documentation

## 2024-02-11 DIAGNOSIS — R030 Elevated blood-pressure reading, without diagnosis of hypertension: Secondary | ICD-10-CM | POA: Diagnosis not present

## 2024-02-11 DIAGNOSIS — R109 Unspecified abdominal pain: Secondary | ICD-10-CM

## 2024-02-11 DIAGNOSIS — E7849 Other hyperlipidemia: Secondary | ICD-10-CM | POA: Diagnosis not present

## 2024-02-11 DIAGNOSIS — E669 Obesity, unspecified: Secondary | ICD-10-CM | POA: Diagnosis not present

## 2024-02-11 DIAGNOSIS — F339 Major depressive disorder, recurrent, unspecified: Secondary | ICD-10-CM

## 2024-02-11 DIAGNOSIS — R739 Hyperglycemia, unspecified: Secondary | ICD-10-CM

## 2024-02-11 DIAGNOSIS — K219 Gastro-esophageal reflux disease without esophagitis: Secondary | ICD-10-CM

## 2024-02-11 MED ORDER — FAMOTIDINE 20 MG PO TABS: 20.0000 mg | ORAL_TABLET | Freq: Two times a day (BID) | ORAL | 1 refills | Status: AC

## 2024-02-15 ENCOUNTER — Encounter (INDEPENDENT_AMBULATORY_CARE_PROVIDER_SITE_OTHER): Payer: Self-pay

## 2024-02-22 ENCOUNTER — Encounter (INDEPENDENT_AMBULATORY_CARE_PROVIDER_SITE_OTHER): Payer: Self-pay

## 2024-03-15 ENCOUNTER — Ambulatory Visit: Admitting: Physician Assistant

## 2024-11-22 ENCOUNTER — Other Ambulatory Visit: Payer: Self-pay | Admitting: Physician Assistant

## 2024-11-22 NOTE — Telephone Encounter (Signed)
 Copied from CRM [id number]. Topic: Clinical - Medication Refill >> Nov 22, 2024 11:26 AM Delon T wrote: Medication: atorvastatin  (LIPITOR) 10 MG tablet-  90 day supply  Has the patient contacted their pharmacy? Yes (Agent: If no, request that the patient contact the pharmacy for the refill. If patient does not wish to contact the pharmacy document the reason why and proceed with request.) (Agent: If yes, when and what did the pharmacy advise?)  This is the patient's preferred pharmacy:    [id number] [place], [place] - [address] AT [facility] [address] [place] [address] Phone: [phone] Fax: [phone]  Is this the correct pharmacy for this prescription? Yes If no, delete pharmacy and type the correct one.   Has the prescription been filled recently? Yes  Is the patient out of the medication? Yes  Has the patient been seen for an appointment in the last year OR does the patient have an upcoming appointment? Yes  Can we respond through MyChart? Yes  Agent: Please be advised that Rx refills may take up to 3 business days. We ask that you follow-up with your pharmacy.

## 2024-11-23 MED ORDER — ATORVASTATIN CALCIUM 10 MG PO TABS: 10.0000 mg | ORAL_TABLET | Freq: Every day | ORAL | 0 refills | Status: AC

## 2024-11-23 NOTE — Telephone Encounter (Signed)
 Requested by patient. Should have enough refills.  Requested Prescriptions  Pending Prescriptions Disp Refills   atorvastatin  (LIPITOR) 10 MG tablet 90 tablet 0    Sig: Take 1 tablet (10 mg total) by mouth daily.     Cardiovascular:  Antilipid - Statins Failed - 11/23/2024  3:25 PM      Failed - Lipid Panel in normal range within the last 12 months    Cholesterol, Total  Date Value Ref Range Status  01/15/2024 238 (H) 100 - 199 mg/dL Final   LDL Chol Calc (NIH)  Date Value Ref Range Status  01/15/2024 135 (H) 0 - 99 mg/dL Final   HDL  Date Value Ref Range Status  01/15/2024 30 (L) >39 mg/dL Final   Triglycerides  Date Value Ref Range Status  01/15/2024 401 (H) 0 - 149 mg/dL Final         Passed - Patient is not pregnant      Passed - Valid encounter within last 12 months    Recent Outpatient Visits           9 months ago Recurrent abdominal pain   [facility] [place], [place], PA-C   10 months ago Recurrent abdominal pain   [facility] [place], Janna, PA-C
